# Patient Record
Sex: Female | Born: 1995
Health system: Southern US, Community
[De-identification: ages and names within clinical notes are randomized; demographics above are authoritative.]

## PROBLEM LIST (undated history)

## (undated) DIAGNOSIS — I10 Essential (primary) hypertension: Secondary | ICD-10-CM

## (undated) DIAGNOSIS — J45909 Unspecified asthma, uncomplicated: Secondary | ICD-10-CM

## (undated) HISTORY — DX: Unspecified asthma, uncomplicated: J45.909

## (undated) HISTORY — PX: BREAST REDUCTION SURGERY: SHX8

---
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
## 2015-08-09 ENCOUNTER — Encounter: Payer: Self-pay | Admitting: Obstetrics & Gynecology

## 2015-08-09 ENCOUNTER — Telehealth: Payer: Self-pay | Admitting: *Deleted

## 2015-08-09 ENCOUNTER — Ambulatory Visit (INDEPENDENT_AMBULATORY_CARE_PROVIDER_SITE_OTHER): Payer: BLUE CROSS/BLUE SHIELD | Admitting: Obstetrics & Gynecology

## 2015-08-09 VITALS — BP 143/82 | HR 89 | Resp 16 | Ht 63.0 in | Wt 180.0 lb

## 2015-08-09 DIAGNOSIS — B358 Other dermatophytoses: Secondary | ICD-10-CM | POA: Diagnosis not present

## 2015-08-09 DIAGNOSIS — N63 Unspecified lump in breast: Secondary | ICD-10-CM | POA: Diagnosis not present

## 2015-08-09 DIAGNOSIS — N6320 Unspecified lump in the left breast, unspecified quadrant: Secondary | ICD-10-CM

## 2015-08-09 MED ORDER — CLOTRIMAZOLE-BETAMETHASONE 1-0.05 % EX CREA: 1.0000 "application " | TOPICAL_CREAM | Freq: Two times a day (BID) | CUTANEOUS | Status: DC

## 2015-08-09 NOTE — Progress Notes (Signed)
   Subjective:    Patient ID: Emily Delgado, female    DOB: [birth year], 19 y.o.   MRN: [id number]  HPI  Pt c/o 2 rpoblems.  First the patient has a lump in left breast near sternum.  It beame red and swollen about 2 weeks ago.  She can still palpate the lump.  It is not painful any more.  There is no associated symptoms.  Nothing makes it better or worse.    Pt also c/o skin rash on legs that she thinks is ring worm.  It was been there for several weeks.  It started on right leg and is not on left hip and side of abdomen.  She has not used any OTC medications.  It is getting worse.  Nothing makes it better.  No pain.  Review of Systems  Constitutional: Negative.   HENT: Negative.   Respiratory: Negative.   Cardiovascular: Negative.   Gastrointestinal: Negative.   Endocrine: Negative.   Genitourinary:       Left breast mass  Musculoskeletal: Negative.   Skin: Positive for rash.  Psychiatric/Behavioral: Negative.    History reviewed. No pertinent past surgical history. Past Medical History  Diagnosis Date  . Asthma    Family history negative for breast, ovarian, cancers.    Objective:   Physical Exam  Constitutional: She is oriented to person, place, and time. She appears well-developed and well-nourished. No distress.  HENT:  Head: Normocephalic and atraumatic.  Eyes: Conjunctivae are normal.  Pulmonary/Chest: Effort normal.  Abdominal: Soft. She exhibits no distension. There is no tenderness.  Neurological: She is alert and oriented to person, place, and time.  Skin:  Skin with red annular lesions on left thigh and right hip.  Psychiatric: She has a normal mood and affect.  Breast:  ? Small lump in left inner quadrant of breast.  Deep.  No skin abnormalities.  Filed Vitals:   08/09/15 1301  BP: 143/82  Pulse: 89  Resp: 16  Height: 5\' 3"  (1.6 m)  Weight: 180 lb (81.647 kg)         Assessment & Plan:  19 yo female with 2 problems  1-Left breast mass  (self reported)--Get target breast US 2-Ring Worm--lotrisome 3-RTC at 20 for pap smear 4-PCP for BP check 4-F/U with PCP if Ring worm doesn't improve

## 2015-08-09 NOTE — Telephone Encounter (Signed)
Called pt to adv needs nurse visit for BP check - LMOM for pt to rtn call

## 2015-08-09 NOTE — Telephone Encounter (Signed)
-----   Message from Lesly DukesKelly H Leggett, MD sent at 08/09/2015  3:25 PM EST ----- Pt's BP was a little elevated.  Can you have her come back in for BP check at some point.

## 2015-08-17 ENCOUNTER — Ambulatory Visit: Payer: BLUE CROSS/BLUE SHIELD | Admitting: Obstetrics & Gynecology

## 2015-09-14 ENCOUNTER — Other Ambulatory Visit: Payer: Self-pay | Admitting: *Deleted

## 2015-09-14 DIAGNOSIS — B359 Dermatophytosis, unspecified: Secondary | ICD-10-CM

## 2015-09-14 MED ORDER — CLOTRIMAZOLE-BETAMETHASONE 1-0.05 % EX CREA: 1.0000 "application " | TOPICAL_CREAM | Freq: Two times a day (BID) | CUTANEOUS | Status: DC

## 2015-09-14 NOTE — Telephone Encounter (Signed)
RF request for Lotrisone cream OK'd @ [facility].  No further RF's will be authorized as patient will need to f/u with PCP.

## 2016-08-24 DIAGNOSIS — Z6831 Body mass index (BMI) 31.0-31.9, adult: Secondary | ICD-10-CM | POA: Diagnosis not present

## 2016-08-24 DIAGNOSIS — Z113 Encounter for screening for infections with a predominantly sexual mode of transmission: Secondary | ICD-10-CM | POA: Diagnosis not present

## 2016-08-24 DIAGNOSIS — Z3200 Encounter for pregnancy test, result unknown: Secondary | ICD-10-CM | POA: Diagnosis not present

## 2016-08-24 DIAGNOSIS — Z01419 Encounter for gynecological examination (general) (routine) without abnormal findings: Secondary | ICD-10-CM | POA: Diagnosis not present

## 2016-09-01 DIAGNOSIS — Z3200 Encounter for pregnancy test, result unknown: Secondary | ICD-10-CM | POA: Diagnosis not present

## 2016-09-25 DIAGNOSIS — R1084 Generalized abdominal pain: Secondary | ICD-10-CM | POA: Diagnosis not present

## 2016-09-25 DIAGNOSIS — Z5329 Procedure and treatment not carried out because of patient's decision for other reasons: Secondary | ICD-10-CM | POA: Diagnosis not present

## 2016-09-26 DIAGNOSIS — N390 Urinary tract infection, site not specified: Secondary | ICD-10-CM | POA: Diagnosis not present

## 2016-09-26 DIAGNOSIS — J45909 Unspecified asthma, uncomplicated: Secondary | ICD-10-CM | POA: Diagnosis not present

## 2016-09-26 DIAGNOSIS — Z7951 Long term (current) use of inhaled steroids: Secondary | ICD-10-CM | POA: Diagnosis not present

## 2016-09-26 DIAGNOSIS — R1031 Right lower quadrant pain: Secondary | ICD-10-CM | POA: Diagnosis not present

## 2016-10-11 DIAGNOSIS — B373 Candidiasis of vulva and vagina: Secondary | ICD-10-CM | POA: Diagnosis not present

## 2016-10-11 DIAGNOSIS — Z113 Encounter for screening for infections with a predominantly sexual mode of transmission: Secondary | ICD-10-CM | POA: Diagnosis not present

## 2016-10-11 DIAGNOSIS — R300 Dysuria: Secondary | ICD-10-CM | POA: Diagnosis not present

## 2016-11-29 DIAGNOSIS — Z3041 Encounter for surveillance of contraceptive pills: Secondary | ICD-10-CM | POA: Diagnosis not present

## 2016-11-29 DIAGNOSIS — R300 Dysuria: Secondary | ICD-10-CM | POA: Diagnosis not present

## 2016-11-29 DIAGNOSIS — N760 Acute vaginitis: Secondary | ICD-10-CM | POA: Diagnosis not present

## 2017-04-11 DIAGNOSIS — K2970 Gastritis, unspecified, without bleeding: Secondary | ICD-10-CM | POA: Diagnosis not present

## 2017-04-11 DIAGNOSIS — R1110 Vomiting, unspecified: Secondary | ICD-10-CM | POA: Diagnosis not present

## 2017-04-11 DIAGNOSIS — J45909 Unspecified asthma, uncomplicated: Secondary | ICD-10-CM | POA: Diagnosis not present

## 2017-04-11 DIAGNOSIS — R112 Nausea with vomiting, unspecified: Secondary | ICD-10-CM | POA: Diagnosis not present

## 2017-04-11 DIAGNOSIS — Z79899 Other long term (current) drug therapy: Secondary | ICD-10-CM | POA: Diagnosis not present

## 2017-05-13 ENCOUNTER — Encounter (HOSPITAL_COMMUNITY): Payer: Self-pay

## 2017-05-13 ENCOUNTER — Emergency Department (HOSPITAL_COMMUNITY): Payer: BLUE CROSS/BLUE SHIELD

## 2017-05-13 ENCOUNTER — Emergency Department (HOSPITAL_COMMUNITY)
Admission: EM | Admit: 2017-05-13 | Discharge: 2017-05-13 | Disposition: A | Discharge status: Home / Self Care | Payer: BLUE CROSS/BLUE SHIELD | Attending: Emergency Medicine | Admitting: Emergency Medicine

## 2017-05-13 DIAGNOSIS — R1030 Lower abdominal pain, unspecified: Secondary | ICD-10-CM | POA: Insufficient documentation

## 2017-05-13 DIAGNOSIS — R112 Nausea with vomiting, unspecified: Secondary | ICD-10-CM | POA: Diagnosis not present

## 2017-05-13 DIAGNOSIS — R109 Unspecified abdominal pain: Secondary | ICD-10-CM | POA: Diagnosis not present

## 2017-05-13 DIAGNOSIS — J45909 Unspecified asthma, uncomplicated: Secondary | ICD-10-CM | POA: Diagnosis not present

## 2017-05-13 LAB — URINALYSIS, ROUTINE W REFLEX MICROSCOPIC
BILIRUBIN URINE: NEGATIVE
GLUCOSE, UA: NEGATIVE mg/dL
Ketones, ur: NEGATIVE mg/dL
NITRITE: NEGATIVE
PH: 6 (ref 5.0–8.0)
Protein, ur: NEGATIVE mg/dL
SPECIFIC GRAVITY, URINE: 1.004 — AB (ref 1.005–1.030)

## 2017-05-13 LAB — CBC WITH DIFFERENTIAL/PLATELET
BASOS PCT: 0 %
Basophils Absolute: 0 10*3/uL (ref 0.0–0.1)
EOS ABS: 0.1 10*3/uL (ref 0.0–0.7)
Eosinophils Relative: 1 %
HCT: 42.9 % (ref 36.0–46.0)
HEMOGLOBIN: 14.9 g/dL (ref 12.0–15.0)
Lymphocytes Relative: 28 %
Lymphs Abs: 2.4 10*3/uL (ref 0.7–4.0)
MCH: 31.3 pg (ref 26.0–34.0)
MCHC: 34.7 g/dL (ref 30.0–36.0)
MCV: 90.1 fL (ref 78.0–100.0)
MONOS PCT: 5 %
Monocytes Absolute: 0.4 10*3/uL (ref 0.1–1.0)
NEUTROS PCT: 66 %
Neutro Abs: 5.7 10*3/uL (ref 1.7–7.7)
Platelets: 331 10*3/uL (ref 150–400)
RBC: 4.76 MIL/uL (ref 3.87–5.11)
RDW: 12 % (ref 11.5–15.5)
WBC: 8.7 10*3/uL (ref 4.0–10.5)

## 2017-05-13 LAB — PREGNANCY, URINE: Preg Test, Ur: NEGATIVE

## 2017-05-13 LAB — COMPREHENSIVE METABOLIC PANEL
ALBUMIN: 4.6 g/dL (ref 3.5–5.0)
ALK PHOS: 76 U/L (ref 38–126)
ALT: 17 U/L (ref 14–54)
ANION GAP: 9 (ref 5–15)
AST: 20 U/L (ref 15–41)
BUN: 10 mg/dL (ref 6–20)
CHLORIDE: 105 mmol/L (ref 101–111)
CO2: 22 mmol/L (ref 22–32)
Calcium: 9.1 mg/dL (ref 8.9–10.3)
Creatinine, Ser: 0.57 mg/dL (ref 0.44–1.00)
GFR calc Af Amer: >60 mL/min (ref >60)
GFR calc non Af Amer: >60 mL/min (ref >60)
GLUCOSE: 87 mg/dL (ref 65–99)
POTASSIUM: 3.6 mmol/L (ref 3.5–5.1)
SODIUM: 136 mmol/L (ref 135–145)
TOTAL PROTEIN: 8 g/dL (ref 6.5–8.1)
Total Bilirubin: 0.6 mg/dL (ref 0.3–1.2)

## 2017-05-13 MED ORDER — ONDANSETRON HCL 4 MG PO TABS: 4.0000 mg | ORAL_TABLET | Freq: Four times a day (QID) | ORAL | 0 refills | Status: DC

## 2017-05-13 MED ORDER — KETOROLAC TROMETHAMINE 60 MG/2ML IM SOLN
60.0000 mg | Freq: Once | INTRAMUSCULAR | Status: AC
  Administered 2017-05-13: 60 mg via INTRAMUSCULAR
  Filled 2017-05-13: qty 2

## 2017-05-13 MED ORDER — DICYCLOMINE HCL 10 MG PO CAPS
10.0000 mg | ORAL_CAPSULE | Freq: Once | ORAL | Status: AC
  Administered 2017-05-13: 10 mg via ORAL
  Filled 2017-05-13: qty 1

## 2017-05-13 MED ORDER — DICYCLOMINE HCL 20 MG PO TABS: 20.0000 mg | ORAL_TABLET | Freq: Two times a day (BID) | ORAL | 0 refills | Status: DC

## 2017-05-13 MED ORDER — PANTOPRAZOLE SODIUM 20 MG PO TBEC: 20.0000 mg | DELAYED_RELEASE_TABLET | Freq: Every day | ORAL | 0 refills | Status: DC

## 2017-05-13 NOTE — ED Provider Notes (Signed)
[facility] Provider Note   CSN: [id number] Arrival date & time: 05/13/17  1049     History   Chief Complaint Chief Complaint  Patient presents with  . Abdominal Pain    HPI Emily Delgado is a 21 y.o. female.  HPI  2 months of intermittent lower abdominal pain. States associated with some constipation decreased flatulence and nausea. Had been seen in the past for and had a negative evaluation to sound follow up with her doctor but she doesn't have a physician so she never did follow-up.she does not notice any other associated symptoms makes this better or makes it worse. She states that they sent her home on some type of strong Tylenol but this does not seem to help much. She recently started her period and does not think it associated with that. She has sexual active without protection but only with one of the person supposedly monogamous as well.  Past Medical History:  Diagnosis Date  . Asthma     There are no active problems to display for this patient.   History reviewed. No pertinent surgical history.  OB History    Gravida Para Term Preterm AB Living   0 0 0 0 0 0   SAB TAB Ectopic Multiple Live Births   0 0 0 0         Home Medications    Prior to Admission medications   Medication Sig Start Date End Date Taking? Authorizing Provider  albuterol (PROVENTIL HFA;VENTOLIN HFA) 108 (90 BASE) MCG/ACT inhaler Inhale 2 puffs into the lungs. 05/19/15 05/13/17 Yes [provider]  ibuprofen (ADVIL,MOTRIN) 200 MG tablet Take 400 mg by mouth every 6 (six) hours as needed for mild pain.   Yes [provider]  dicyclomine (BENTYL) 20 MG tablet Take 1 tablet (20 mg total) by mouth 2 (two) times daily. 05/13/17   Jeanne Terrance, Barbara Cower, MD  ondansetron (ZOFRAN) 4 MG tablet Take 1 tablet (4 mg total) by mouth every 6 (six) hours. 05/13/17   Keiley Levey, Barbara Cower, MD  pantoprazole (PROTONIX) 20 MG tablet Take 1 tablet (20 mg total) by mouth daily. 05/13/17    Lylianna Fraiser, Barbara Cower, MD    Family History No family history on file.  Social History Social History  Substance Use Topics  . Smoking status: Never Smoker  . Smokeless tobacco: Never Used  . Alcohol use 1.2 oz/week    2 Cans of beer per week     Allergies   Patient has no known allergies.   Review of Systems Review of Systems  All other systems reviewed and are negative.    Physical Exam Updated Vital Signs BP 134/72 (BP Location: Left Arm)   Pulse 81   Temp 98.5 F (36.9 C) (Oral)   Resp 16   Ht 5\' 4"  (1.626 m)   Wt 77.1 kg (170 lb)   LMP 05/12/2017 (Exact Date)   SpO2 96%   BMI 29.18 kg/m   Physical Exam  Constitutional: She appears well-developed and well-nourished.  HENT:  Head: Normocephalic and atraumatic.  Eyes: Conjunctivae and EOM are normal.  Neck: Normal range of motion.  Cardiovascular: Normal rate and regular rhythm.   Pulmonary/Chest: No stridor. No respiratory distress. She has no wheezes.  Abdominal: Soft. Bowel sounds are normal. She exhibits no distension. There is no tenderness. There is no guarding.  Neurological: She is alert.  Skin: Skin is warm and dry.  Nursing note and vitals reviewed.    ED Treatments / Results  Labs (all labs ordered are listed, but only abnormal results are displayed) Labs Reviewed  URINALYSIS, ROUTINE W REFLEX MICROSCOPIC - Abnormal; Notable for the following:       Result Value   Specific Gravity, Urine 1.004 (*)    Hgb urine dipstick SMALL (*)    Leukocytes, UA TRACE (*)    Bacteria, UA RARE (*)    Squamous Epithelial / LPF 0-5 (*)    All other components within normal limits  URINE CULTURE  CBC WITH DIFFERENTIAL/PLATELET  COMPREHENSIVE METABOLIC PANEL  PREGNANCY, URINE    EKG  EKG Interpretation None       Radiology Ct Renal Stone Study  Result Date: 05/13/2017 CLINICAL DATA:  Abdominal pain with nausea and bloating for 2 months. Flank pain and suspect stone disease. EXAM: CT ABDOMEN AND  PELVIS WITHOUT CONTRAST TECHNIQUE: Multidetector CT imaging of the abdomen and pelvis was performed following the standard protocol without IV contrast. COMPARISON:  None. FINDINGS: Lower chest: Partially calcified 5 mm nodule in the right lower lobe on sequence 3, image 2 is compatible with a calcified granuloma. No pleural effusions. 4 mm nodule in the left lower lobe on sequence 3, image 24. 5 mm nodule in left lower lobe on image 13. Hepatobiliary: Normal appearance the liver and gallbladder. Incidentally, the gallbladder fundus is folded on itself. Pancreas: Normal appearance of the pancreas without inflammation or duct dilatation. Spleen: Normal appearance of spleen without enlargement. Adrenals/Urinary Tract: Normal adrenal glands. Normal kidneys. No hydronephrosis and no stones. Urinary bladder is unremarkable. Stomach/Bowel: Stomach is within normal limits. No evidence of bowel wall thickening, distention, or inflammatory changes. Appendix is along the posterior cecum and near the right adnexa. No acute inflammatory changes in this area. Vascular/Lymphatic: No significant vascular findings are present. No enlarged abdominal or pelvic lymph nodes. Reproductive: Uterus and bilateral adnexa are unremarkable. Other: No significant free fluid.  No free air. Musculoskeletal: No acute or significant osseous findings. IMPRESSION: No acute abnormality in the abdomen or pelvis. Specifically, no evidence for kidney stones or hydronephrosis. Few small pulmonary nodules. These probably represent granulomas in a patient of this age. Electronically Signed   By: Richarda Overlie M.D.   On: 05/13/2017 14:10    Procedures Procedures (including critical care time)  Medications Ordered in ED Medications  ketorolac (TORADOL) injection 60 mg (60 mg Intramuscular Given 05/13/17 1240)  dicyclomine (BENTYL) capsule 10 mg (10 mg Oral Given 05/13/17 1239)     Initial Impression / Assessment and Plan / ED Course  I have reviewed  the triage vital signs and the nursing notes.  Pertinent labs & imaging results that were available during my care of the patient were reviewed by me and considered in my medical decision making (see chart for details).     Renal stone versus IBS versus colitis.  Workup unremarkable. pateint states pending pcp appointment, stable for dc and outpatient management at this time.   Final Clinical Impressions(s) / ED Diagnoses   Final diagnoses:  Abdominal pain, unspecified abdominal location    New Prescriptions Discharge Medication List as of 05/13/2017  2:38 PM    START taking these medications   Details  dicyclomine (BENTYL) 20 MG tablet Take 1 tablet (20 mg total) by mouth 2 (two) times daily., Starting Sun 05/13/2017, Print    ondansetron (ZOFRAN) 4 MG tablet Take 1 tablet (4 mg total) by mouth every 6 (six) hours., Starting Sun 05/13/2017, Print    pantoprazole (PROTONIX) 20 MG tablet Take 1  tablet (20 mg total) by mouth daily., Starting Sun 05/13/2017, Print         Nil Bolser, Barbara Cower, MD 05/14/17 1044

## 2017-05-13 NOTE — ED Triage Notes (Addendum)
Pt c/o abd pain, nausea, and bloating x2 months. Denies vomiting. Pt reports being seen at [facility] and having blood work taken but states "they didn't really do a lot other than give me a general medicine for stomach pain." Pt states she isn't sure what the medication is called.

## 2017-05-13 NOTE — ED Notes (Signed)
Patient transported to CT 

## 2017-05-13 NOTE — ED Notes (Signed)
ED Provider at bedside. 

## 2017-05-15 LAB — URINE CULTURE

## 2017-06-28 DIAGNOSIS — R10817 Generalized abdominal tenderness: Secondary | ICD-10-CM | POA: Diagnosis not present

## 2017-06-28 DIAGNOSIS — R109 Unspecified abdominal pain: Secondary | ICD-10-CM | POA: Diagnosis not present

## 2017-06-28 DIAGNOSIS — R140 Abdominal distension (gaseous): Secondary | ICD-10-CM | POA: Diagnosis not present

## 2017-06-28 DIAGNOSIS — R110 Nausea: Secondary | ICD-10-CM | POA: Diagnosis not present

## 2017-06-28 DIAGNOSIS — Z1389 Encounter for screening for other disorder: Secondary | ICD-10-CM | POA: Diagnosis not present

## 2017-07-20 DIAGNOSIS — N62 Hypertrophy of breast: Secondary | ICD-10-CM | POA: Diagnosis not present

## 2017-08-15 DIAGNOSIS — N62 Hypertrophy of breast: Secondary | ICD-10-CM | POA: Diagnosis not present

## 2017-09-04 DIAGNOSIS — M542 Cervicalgia: Secondary | ICD-10-CM | POA: Diagnosis not present

## 2017-09-04 DIAGNOSIS — M25511 Pain in right shoulder: Secondary | ICD-10-CM | POA: Diagnosis not present

## 2017-09-04 DIAGNOSIS — N62 Hypertrophy of breast: Secondary | ICD-10-CM | POA: Diagnosis not present

## 2017-09-04 DIAGNOSIS — M546 Pain in thoracic spine: Secondary | ICD-10-CM | POA: Diagnosis not present

## 2017-09-08 ENCOUNTER — Other Ambulatory Visit: Payer: Self-pay

## 2017-09-08 ENCOUNTER — Ambulatory Visit (HOSPITAL_COMMUNITY)
Admission: EM | Admit: 2017-09-08 | Discharge: 2017-09-08 | Disposition: A | Discharge status: Home / Self Care | Payer: BLUE CROSS/BLUE SHIELD | Attending: Family Medicine | Admitting: Family Medicine

## 2017-09-08 ENCOUNTER — Ambulatory Visit (INDEPENDENT_AMBULATORY_CARE_PROVIDER_SITE_OTHER): Payer: BLUE CROSS/BLUE SHIELD

## 2017-09-08 ENCOUNTER — Encounter (HOSPITAL_COMMUNITY): Payer: Self-pay | Admitting: *Deleted

## 2017-09-08 DIAGNOSIS — J4520 Mild intermittent asthma, uncomplicated: Secondary | ICD-10-CM | POA: Diagnosis not present

## 2017-09-08 DIAGNOSIS — J029 Acute pharyngitis, unspecified: Secondary | ICD-10-CM | POA: Diagnosis not present

## 2017-09-08 DIAGNOSIS — R0602 Shortness of breath: Secondary | ICD-10-CM | POA: Diagnosis not present

## 2017-09-08 DIAGNOSIS — R05 Cough: Secondary | ICD-10-CM

## 2017-09-08 DIAGNOSIS — J209 Acute bronchitis, unspecified: Secondary | ICD-10-CM | POA: Diagnosis not present

## 2017-09-08 MED ORDER — AZITHROMYCIN 250 MG PO TABS: 250.0000 mg | ORAL_TABLET | Freq: Every day | ORAL | 0 refills | Status: DC

## 2017-09-08 MED ORDER — IPRATROPIUM-ALBUTEROL 0.5-2.5 (3) MG/3ML IN SOLN
3.0000 mL | Freq: Once | RESPIRATORY_TRACT | Status: AC
  Administered 2017-09-08: 3 mL via RESPIRATORY_TRACT

## 2017-09-08 MED ORDER — IPRATROPIUM-ALBUTEROL 0.5-2.5 (3) MG/3ML IN SOLN
RESPIRATORY_TRACT | Status: AC
  Filled 2017-09-08: qty 3

## 2017-09-08 MED ORDER — PREDNISONE 20 MG PO TABS: ORAL_TABLET | ORAL | 0 refills | Status: DC

## 2017-09-08 NOTE — Discharge Instructions (Signed)
Please return if you are not improving in the next 1-2 days.

## 2017-09-08 NOTE — ED Notes (Signed)
Patient receiving breathing trx prior to CXR

## 2017-09-08 NOTE — ED Notes (Signed)
Discussed C/O's and VS with Dr. Milus GlazierLauenstein.

## 2017-09-08 NOTE — ED Provider Notes (Signed)
[facility]   [id number] 09/08/17 Arrival Time: 1217   SUBJECTIVE:  Korynne Docia FurlJane Mineo is a 21 y.o. female who presents to the urgent care with complaint of S/P breast reduction 3 days ago.  Started with sore throat following surgery - told likely from ET tube during surgery.  Yesterday started with feeling SOB with talking, cough, bilat "inner ear congestion".  Notified surgeon - was suggested she have breathing treatment and get checked.     Past Medical History:  Diagnosis Date  . Asthma    History reviewed. No pertinent family history. Social History   Socioeconomic History  . Marital status: Single    Spouse name: Not on file  . Number of children: Not on file  . Years of education: Not on file  . Highest education level: Not on file  Social Needs  . Financial resource strain: Not on file  . Food insecurity - worry: Not on file  . Food insecurity - inability: Not on file  . Transportation needs - medical: Not on file  . Transportation needs - non-medical: Not on file  Occupational History  . Occupation: Agricultural engineermakeup artist  Tobacco Use  . Smoking status: Never Smoker  . Smokeless tobacco: Never Used  Substance and Sexual Activity  . Alcohol use: No    Alcohol/week: 0.0 oz    Frequency: Never  . Drug use: No  . Sexual activity: Not on file  Other Topics Concern  . Not on file  Social History Narrative  . Not on file   Current Meds  Medication Sig  . albuterol (PROVENTIL HFA;VENTOLIN HFA) 108 (90 BASE) MCG/ACT inhaler Inhale 2 puffs into the lungs.  Marland Kitchen. HYDROmorphone (DILAUDID) 2 MG tablet Take by mouth every 4 (four) hours as needed for severe pain.  . methocarbamol (ROBAXIN) 500 MG tablet Take 500 mg by mouth 4 (four) times daily.  . [DISCONTINUED] GuaiFENesin (MUCINEX PO) Take by mouth.   No Known Allergies    ROS: As per HPI, remainder of ROS negative.   OBJECTIVE:   Vitals:   09/08/17 1302 09/08/17 1441  BP: 118/80   Pulse: (!) 117  (!) 111  Resp: (!) 24 20  Temp: 98 F (36.7 C)   TempSrc: Oral   SpO2: 93% 96%     General appearance: alert; no distress Eyes: PERRL; EOMI; conjunctiva normal HENT: normocephalic; atraumatic; TMs normal, canal normal, external ears normal without trauma; nasal mucosa normal; oral mucosa normal Neck: supple Lungs: rales and wheezes to auscultation left side; congested cough Heart: rapid rate and regular rhythm Back: no CVA tenderness Extremities: no cyanosis or edema; symmetrical with no gross deformities Skin: warm and dry Neurologic: normal gait; grossly normal Psychological: alert and cooperative; normal mood and affect   Labs:  Results for orders placed or performed during the hospital encounter of 05/13/17  Urine culture  Result Value Ref Range   Specimen Description URINE, RANDOM    Special Requests NONE    Culture MULTIPLE SPECIES PRESENT, SUGGEST RECOLLECTION (A)    Report Status 05/15/2017 FINAL   CBC with Differential  Result Value Ref Range   WBC 8.7 4.0 - 10.5 K/uL   RBC 4.76 3.87 - 5.11 MIL/uL   Hemoglobin 14.9 12.0 - 15.0 g/dL   HCT 95.642.9 21.336.0 - 08.646.0 %   MCV 90.1 78.0 - 100.0 fL   MCH 31.3 26.0 - 34.0 pg   MCHC 34.7 30.0 - 36.0 g/dL   RDW 57.812.0 46.911.5 - 62.915.5 %  Platelets 331 150 - 400 K/uL   Neutrophils Relative % 66 %   Neutro Abs 5.7 1.7 - 7.7 K/uL   Lymphocytes Relative 28 %   Lymphs Abs 2.4 0.7 - 4.0 K/uL   Monocytes Relative 5 %   Monocytes Absolute 0.4 0.1 - 1.0 K/uL   Eosinophils Relative 1 %   Eosinophils Absolute 0.1 0.0 - 0.7 K/uL   Basophils Relative 0 %   Basophils Absolute 0.0 0.0 - 0.1 K/uL  Comprehensive metabolic panel  Result Value Ref Range   Sodium 136 135 - 145 mmol/L   Potassium 3.6 3.5 - 5.1 mmol/L   Chloride 105 101 - 111 mmol/L   CO2 22 22 - 32 mmol/L   Glucose, Bld 87 65 - 99 mg/dL   BUN 10 6 - 20 mg/dL   Creatinine, Ser 9.60 0.44 - 1.00 mg/dL   Calcium 9.1 8.9 - 45.4 mg/dL   Total Protein 8.0 6.5 - 8.1 g/dL   Albumin  4.6 3.5 - 5.0 g/dL   AST 20 15 - 41 U/L   ALT 17 14 - 54 U/L   Alkaline Phosphatase 76 38 - 126 U/L   Total Bilirubin 0.6 0.3 - 1.2 mg/dL   GFR calc non Af Amer >60 >60 mL/min   GFR calc Af Amer >60 >60 mL/min   Anion gap 9 5 - 15  Urinalysis, Routine w reflex microscopic  Result Value Ref Range   Color, Urine YELLOW YELLOW   APPearance CLEAR CLEAR   Specific Gravity, Urine 1.004 (L) 1.005 - 1.030   pH 6.0 5.0 - 8.0   Glucose, UA NEGATIVE NEGATIVE mg/dL   Hgb urine dipstick SMALL (A) NEGATIVE   Bilirubin Urine NEGATIVE NEGATIVE   Ketones, ur NEGATIVE NEGATIVE mg/dL   Protein, ur NEGATIVE NEGATIVE mg/dL   Nitrite NEGATIVE NEGATIVE   Leukocytes, UA TRACE (A) NEGATIVE   RBC / HPF 0-5 0 - 5 RBC/hpf   WBC, UA 0-5 0 - 5 WBC/hpf   Bacteria, UA RARE (A) NONE SEEN   Squamous Epithelial / LPF 0-5 (A) NONE SEEN  Pregnancy, urine  Result Value Ref Range   Preg Test, Ur NEGATIVE NEGATIVE    Labs Reviewed - No data to display  Dg Chest 2 View  Result Date: 09/08/2017 CLINICAL DATA:  Cough, shortness of breath and history of asthma. EXAM: CHEST  2 VIEW COMPARISON:  None. FINDINGS: The heart size and mediastinal contours are within normal limits. Lung volumes are normal. There is some mild bronchial thickening bilaterally and potentially mild mucous plugging versus atelectasis in focal areas of the left upper lobe and left lower lobe. No edema, pneumothorax or pleural fluid. The visualized skeletal structures are unremarkable. IMPRESSION: Mild bronchial thickening bilaterally. Potential focal areas of mucous plugging versus atelectasis in the left upper and lower lobes. Electronically Signed   By: Irish Lack M.D.   On: 09/08/2017 14:30     Oxygen sat 96-97 after breathing treatment  ASSESSMENT & PLAN:  1. Acute bronchitis, unspecified organism   2. Mild intermittent asthma without complication     Meds ordered this encounter  Medications  . ipratropium-albuterol (DUONEB) 0.5-2.5  (3) MG/3ML nebulizer solution 3 mL  . azithromycin (ZITHROMAX) 250 MG tablet    Sig: Take 1 tablet (250 mg total) by mouth daily. Take first 2 tablets together, then 1 every day until finished.    Dispense:  6 tablet    Refill:  0  . predniSONE (DELTASONE) 20 MG tablet  Sig: Two daily with food    Dispense:  10 tablet    Refill:  0    Reviewed expectations re: course of current medical issues. Questions answered. Outlined signs and symptoms indicating need for more acute intervention. Patient verbalized understanding. After Visit Summary given.    Procedures:      Elvina SidleLauenstein, Adelfa Lozito, MD 09/08/17 1443

## 2017-09-08 NOTE — ED Triage Notes (Addendum)
Pt 4 days S/P breast reduction.  Started with sore throat following surgery - told likely from ET tube during surgery.  Yesterday started with feeling SOB with talking, cough, bilat "inner ear congestion".  Notified surgeon - was suggested she have breathing treatment and get checked.  Has been taking Mucinex and albuterol without relief.

## 2018-02-09 IMAGING — CT CT RENAL STONE PROTOCOL
2 of 3 series · 16 of 46 positions shown, 18 images · non-contrast
Comparison: None.

CLINICAL DATA: Abdominal pain with nausea and bloating for 2
months. Flank pain and suspect stone disease.

EXAM:
CT ABDOMEN AND PELVIS WITHOUT CONTRAST
TECHNIQUE: Multidetector CT imaging of the abdomen and pelvis was performed
following the standard protocol without IV contrast.

[Series 3: lung · axial · 0.74mm/px · z∈[+1166,+1254]mm · 13 of 52 slices shown, 15 images]
[im 4/52  soft-tissue]
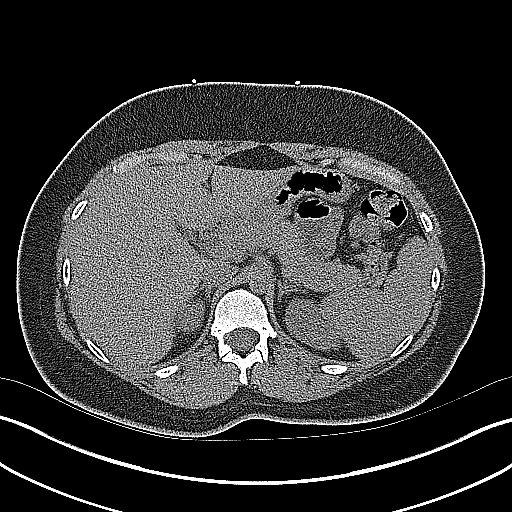
[im 4/52  bone]
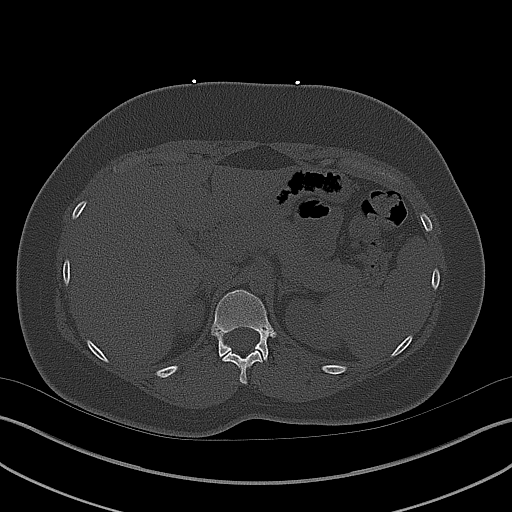
[im 7/52  soft-tissue]
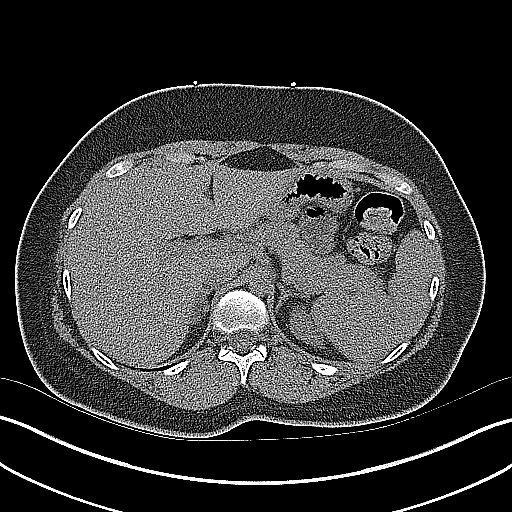
[im 10/52  soft-tissue]
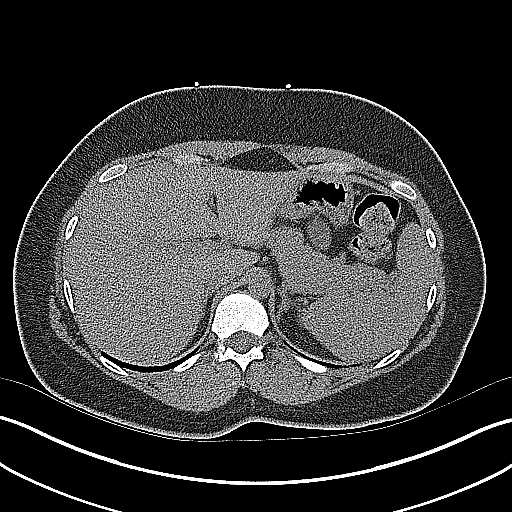
[im 15/52  soft-tissue]
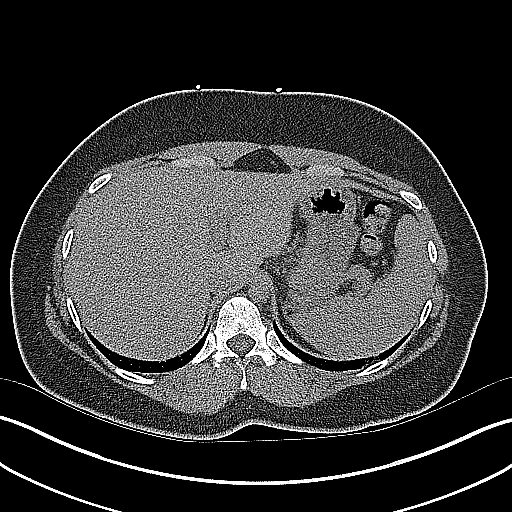
[im 19/52  soft-tissue]
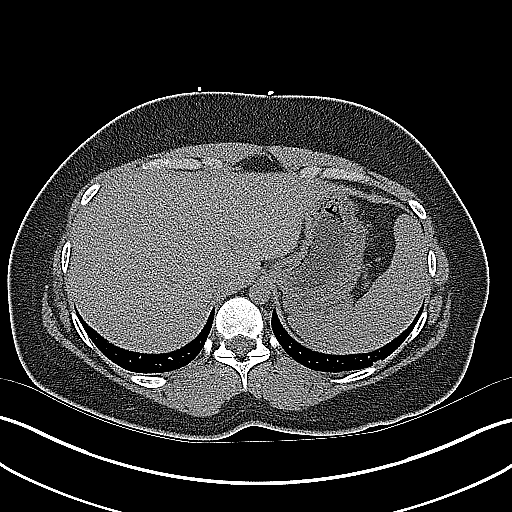
[im 22/52  soft-tissue]
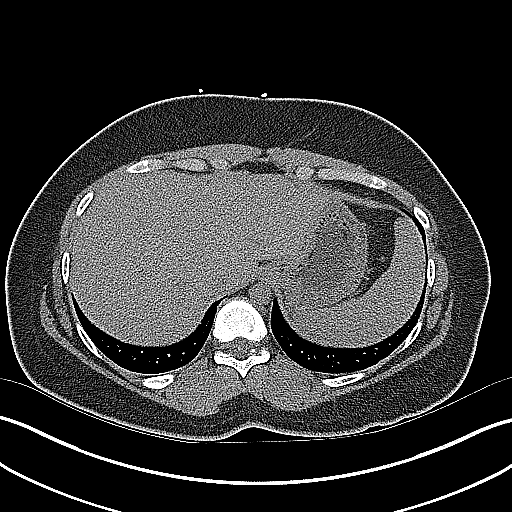
[im 27/52  soft-tissue]
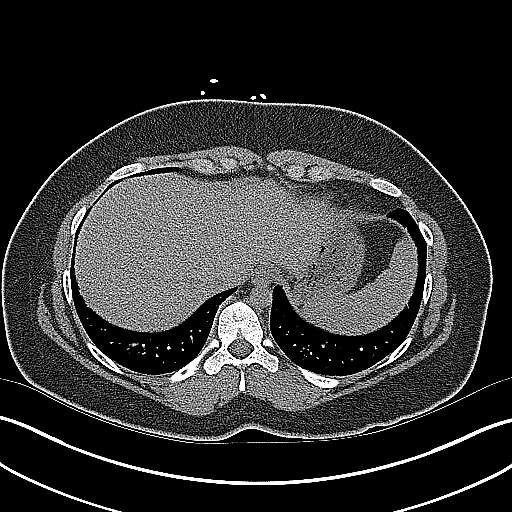
[im 30/52  soft-tissue]
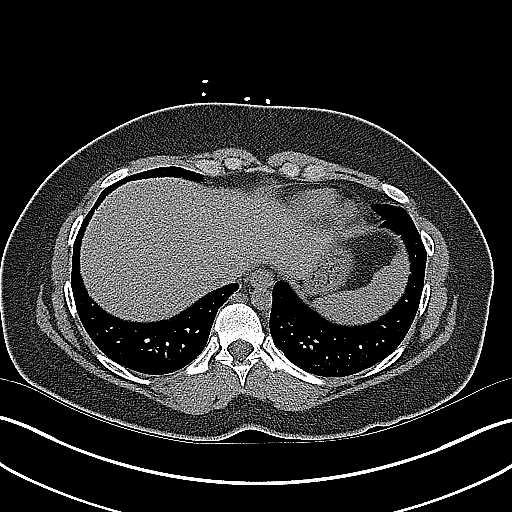
[im 33/52  soft-tissue]
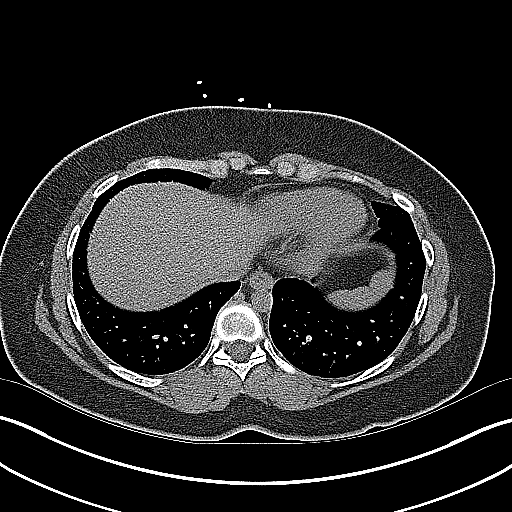
[im 33/52  bone]
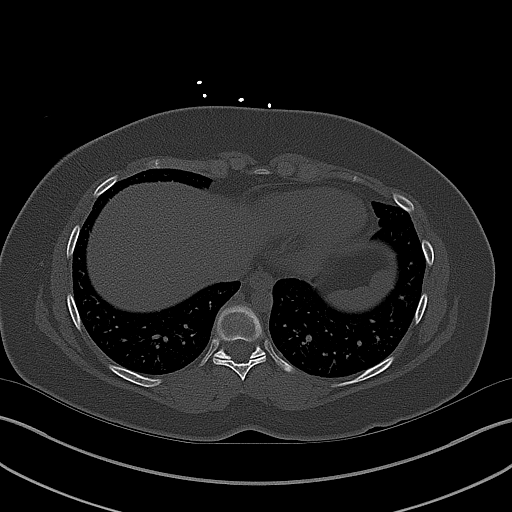
[im 37/52  soft-tissue]
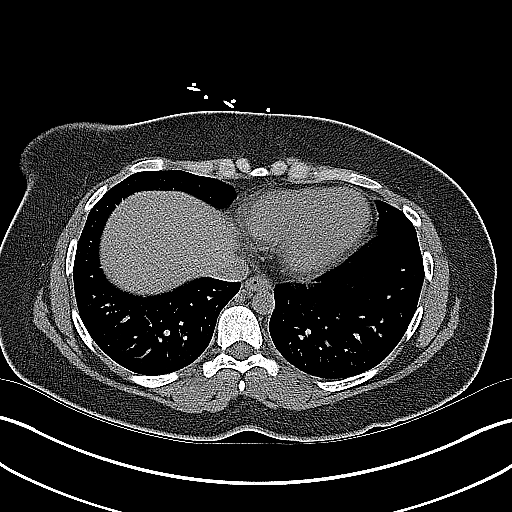
[im 42/52  soft-tissue]
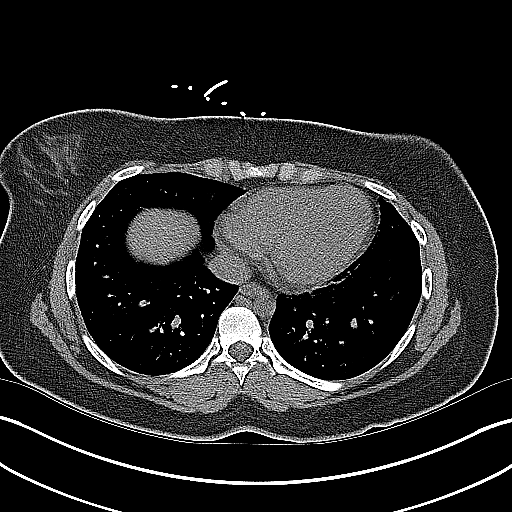
[im 45/52  soft-tissue]
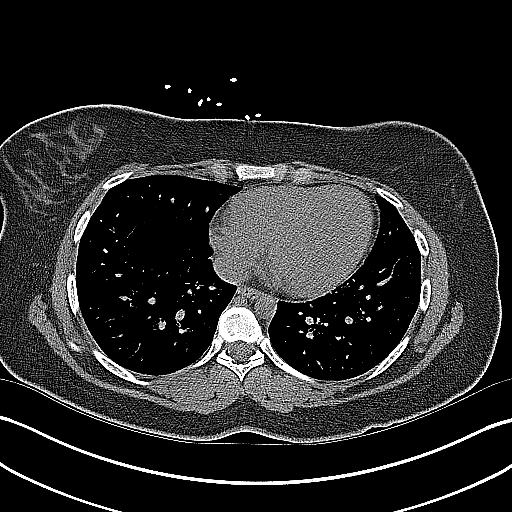
[im 48/52  soft-tissue]
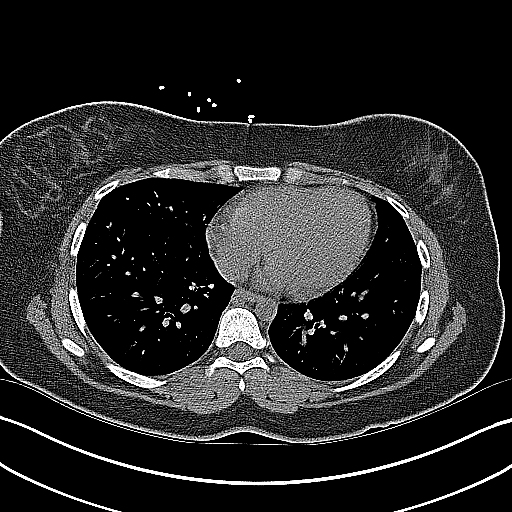

[Series 4: coronal · coronal · 0.74mm/px · 3 of 119 slices shown]
[im 40/119  soft-tissue]
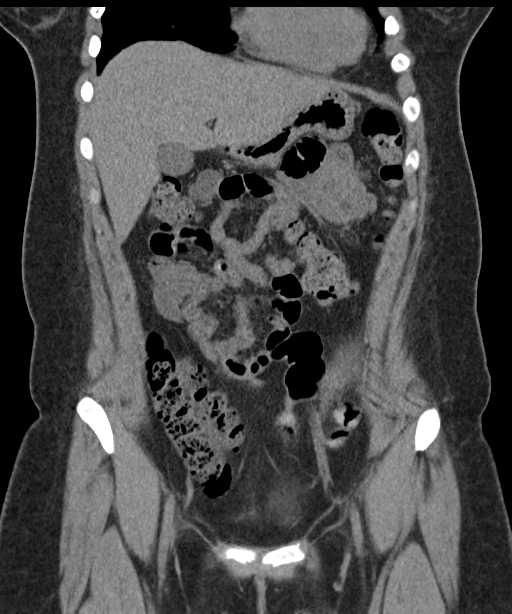
[im 53/119  soft-tissue]
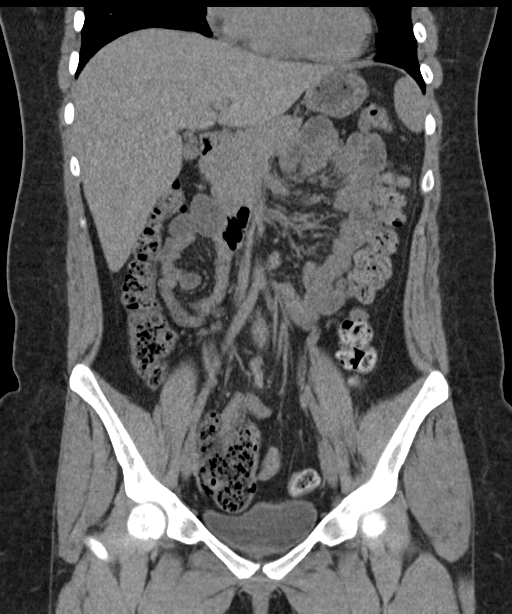
[im 66/119  soft-tissue]
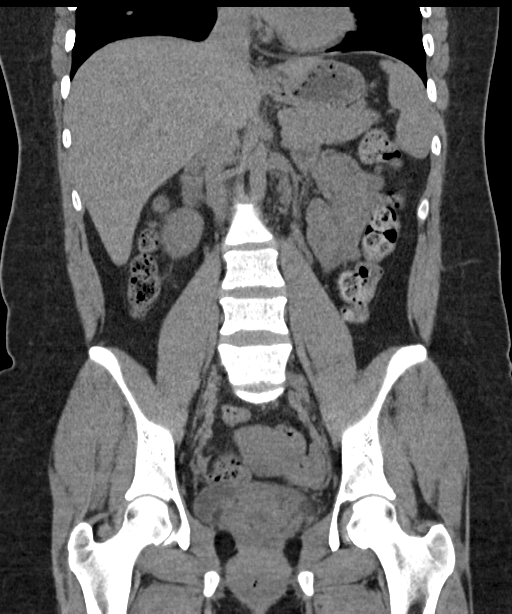

[16 of 46 positions shown; findings below may reference images not displayed]

FINDINGS: Lower chest: Partially calcified 5 mm nodule in the right lower lobe
on sequence 3, image 2 is compatible with a calcified granuloma. No
pleural effusions. 4 mm nodule in the left lower lobe on sequence 3,
image 24. 5 mm nodule in left lower lobe on image 13.

Hepatobiliary: Normal appearance the liver and gallbladder.
Incidentally, the gallbladder fundus is folded on itself.

Pancreas: Normal appearance of the pancreas without inflammation or
duct dilatation.

Spleen: Normal appearance of spleen without enlargement.

Adrenals/Urinary Tract: Normal adrenal glands. Normal kidneys. No
hydronephrosis and no stones. Urinary bladder is unremarkable.

Stomach/Bowel: Stomach is within normal limits. No evidence of bowel
wall thickening, distention, or inflammatory changes. Appendix is
along the posterior cecum and near the right adnexa. No acute
inflammatory changes in this area.

Vascular/Lymphatic: No significant vascular findings are present. No
enlarged abdominal or pelvic lymph nodes.

Reproductive: Uterus and bilateral adnexa are unremarkable.

Other: No significant free fluid.  No free air.

Musculoskeletal: No acute or significant osseous findings.
IMPRESSION: No acute abnormality in the abdomen or pelvis. Specifically, no
evidence for kidney stones or hydronephrosis.

Few small pulmonary nodules. These probably represent granulomas in
a patient of this age.

## 2018-04-17 DIAGNOSIS — Z6835 Body mass index (BMI) 35.0-35.9, adult: Secondary | ICD-10-CM | POA: Diagnosis not present

## 2018-04-17 DIAGNOSIS — N915 Oligomenorrhea, unspecified: Secondary | ICD-10-CM | POA: Diagnosis not present

## 2018-04-17 DIAGNOSIS — Z01419 Encounter for gynecological examination (general) (routine) without abnormal findings: Secondary | ICD-10-CM | POA: Diagnosis not present

## 2018-04-17 DIAGNOSIS — E229 Hyperfunction of pituitary gland, unspecified: Secondary | ICD-10-CM | POA: Diagnosis not present

## 2018-04-24 DIAGNOSIS — N926 Irregular menstruation, unspecified: Secondary | ICD-10-CM | POA: Diagnosis not present

## 2018-04-24 DIAGNOSIS — E229 Hyperfunction of pituitary gland, unspecified: Secondary | ICD-10-CM | POA: Diagnosis not present

## 2018-06-07 IMAGING — DX DG CHEST 2V
2 series · 2 of 2 positions shown · non-contrast
Comparison: None.

CLINICAL DATA: Cough, shortness of breath and history of asthma.

EXAM:
CHEST  2 VIEW

[chest pa]
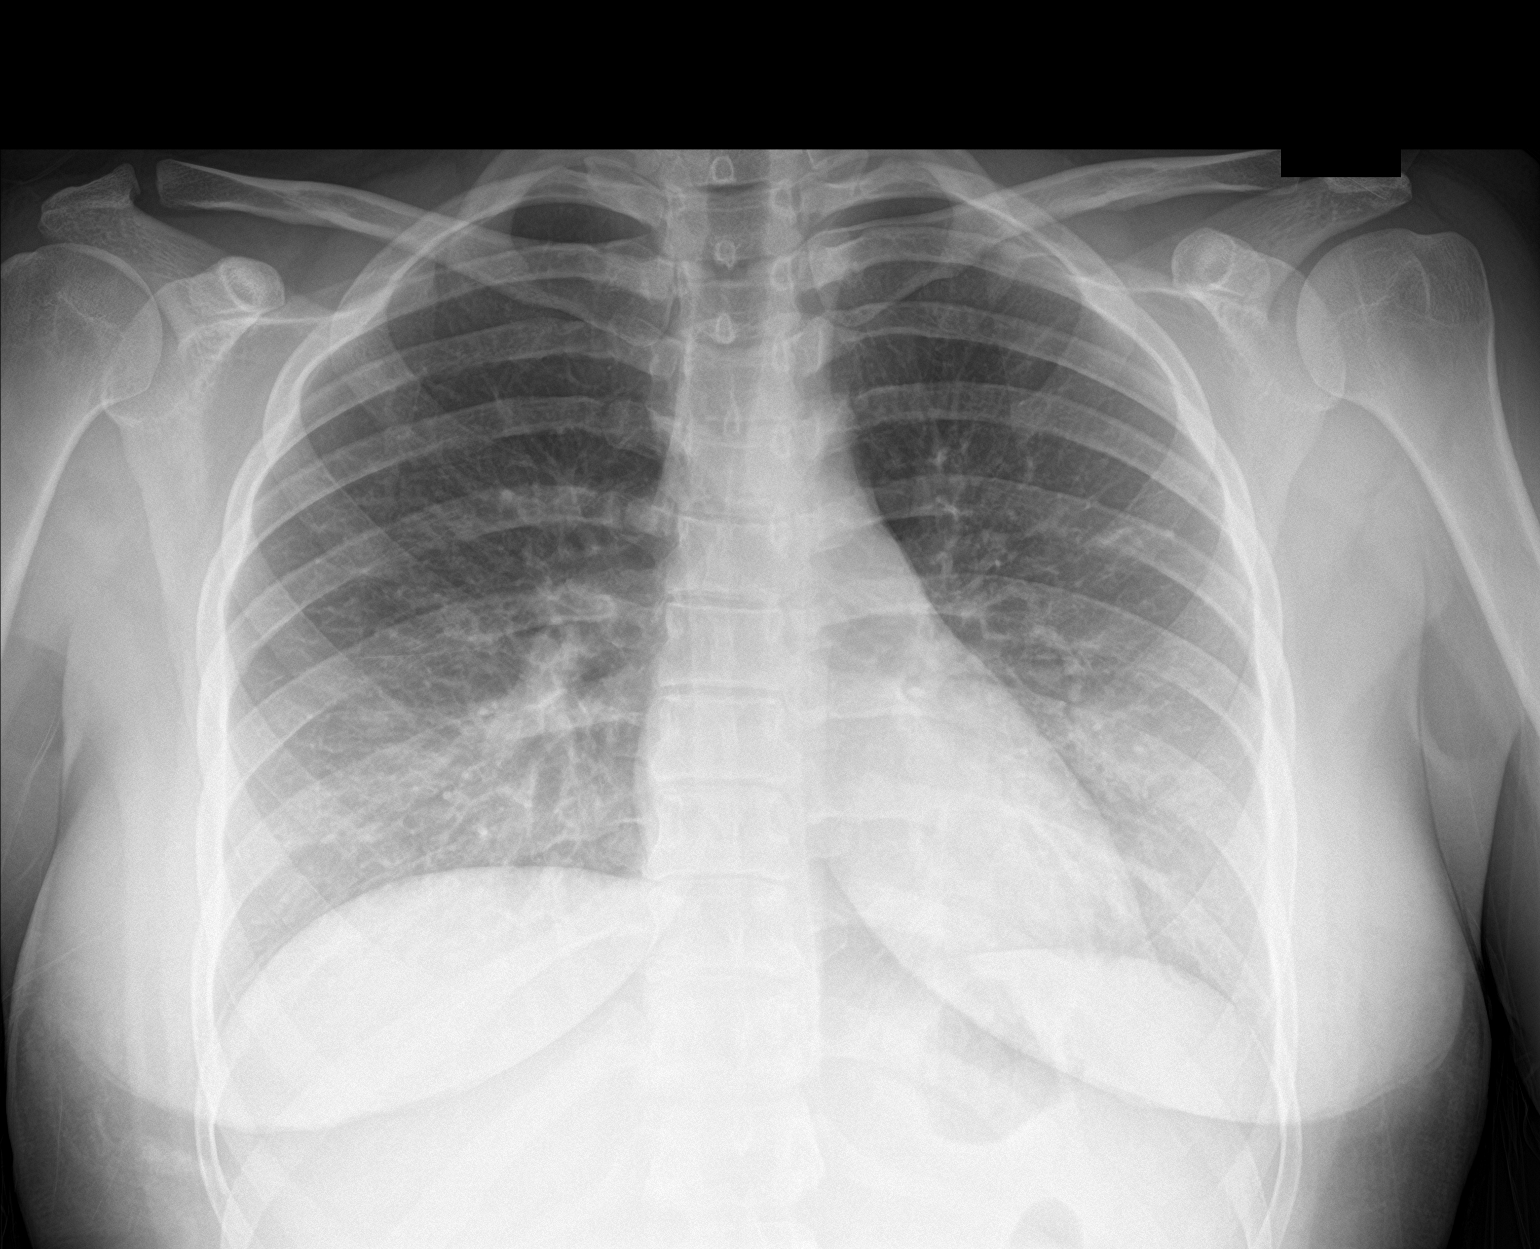

[chest lat]
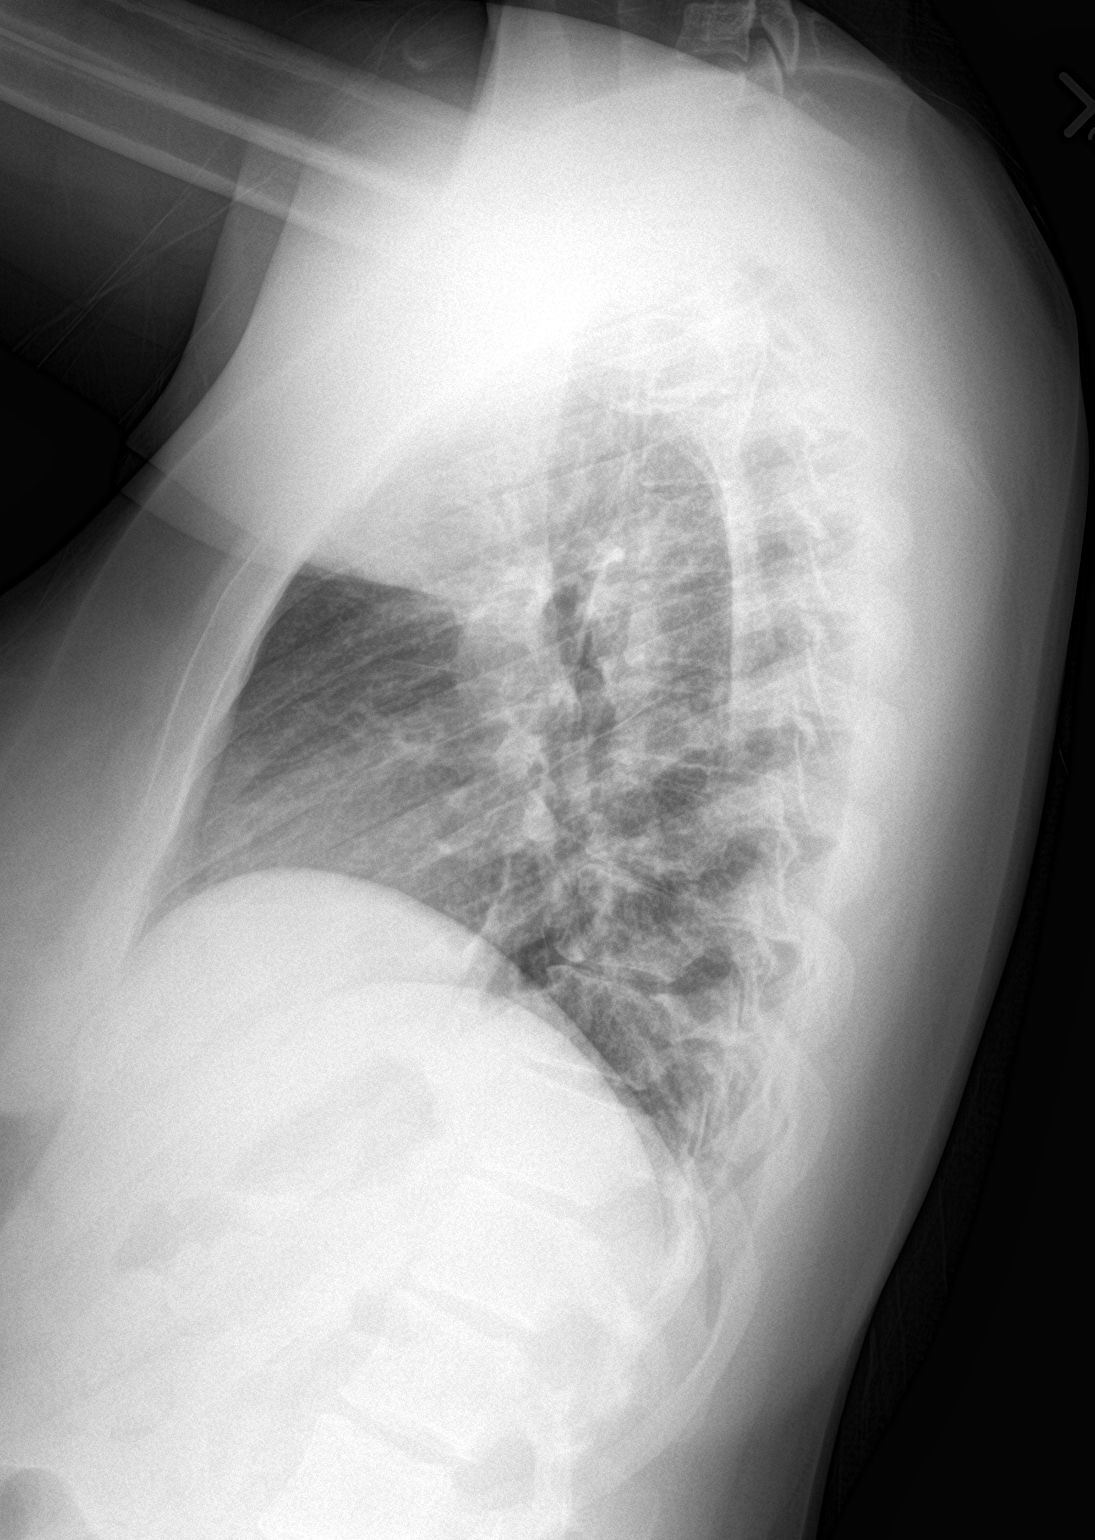

[2 of 2 positions shown; findings below may reference images not displayed]

FINDINGS: The heart size and mediastinal contours are within normal limits.
Lung volumes are normal. There is some mild bronchial thickening
bilaterally and potentially mild mucous plugging versus atelectasis
in focal areas of the left upper lobe and left lower lobe. No edema,
pneumothorax or pleural fluid. The visualized skeletal structures
are unremarkable.
IMPRESSION: Mild bronchial thickening bilaterally. Potential focal areas of
mucous plugging versus atelectasis in the left upper and lower
lobes.

## 2018-07-25 DIAGNOSIS — E229 Hyperfunction of pituitary gland, unspecified: Secondary | ICD-10-CM | POA: Diagnosis not present

## 2018-07-25 DIAGNOSIS — N915 Oligomenorrhea, unspecified: Secondary | ICD-10-CM | POA: Diagnosis not present

## 2018-11-25 DIAGNOSIS — Z3201 Encounter for pregnancy test, result positive: Secondary | ICD-10-CM | POA: Diagnosis not present

## 2018-12-13 DIAGNOSIS — Z3201 Encounter for pregnancy test, result positive: Secondary | ICD-10-CM | POA: Diagnosis not present

## 2018-12-13 DIAGNOSIS — J45909 Unspecified asthma, uncomplicated: Secondary | ICD-10-CM | POA: Diagnosis not present

## 2018-12-25 DIAGNOSIS — Z3401 Encounter for supervision of normal first pregnancy, first trimester: Secondary | ICD-10-CM | POA: Diagnosis not present

## 2018-12-25 LAB — OB RESULTS CONSOLE GC/CHLAMYDIA
Chlamydia: NEGATIVE
Gonorrhea: NEGATIVE

## 2018-12-25 LAB — OB RESULTS CONSOLE RUBELLA ANTIBODY, IGM: Rubella: IMMUNE

## 2018-12-25 LAB — OB RESULTS CONSOLE ANTIBODY SCREEN: Antibody Screen: NEGATIVE

## 2018-12-25 LAB — OB RESULTS CONSOLE ABO/RH: RH Type: NEGATIVE

## 2018-12-25 LAB — OB RESULTS CONSOLE HEPATITIS B SURFACE ANTIGEN: Hepatitis B Surface Ag: NEGATIVE

## 2018-12-25 LAB — OB RESULTS CONSOLE RPR: RPR: NONREACTIVE

## 2018-12-25 LAB — OB RESULTS CONSOLE HIV ANTIBODY (ROUTINE TESTING): HIV: NONREACTIVE

## 2019-01-08 DIAGNOSIS — Z3689 Encounter for other specified antenatal screening: Secondary | ICD-10-CM | POA: Diagnosis not present

## 2019-01-08 DIAGNOSIS — Z118 Encounter for screening for other infectious and parasitic diseases: Secondary | ICD-10-CM | POA: Diagnosis not present

## 2019-01-08 DIAGNOSIS — Z3401 Encounter for supervision of normal first pregnancy, first trimester: Secondary | ICD-10-CM | POA: Diagnosis not present

## 2019-01-08 DIAGNOSIS — Z113 Encounter for screening for infections with a predominantly sexual mode of transmission: Secondary | ICD-10-CM | POA: Diagnosis not present

## 2019-01-08 DIAGNOSIS — O161 Unspecified maternal hypertension, first trimester: Secondary | ICD-10-CM | POA: Diagnosis not present

## 2019-01-08 DIAGNOSIS — Z124 Encounter for screening for malignant neoplasm of cervix: Secondary | ICD-10-CM | POA: Diagnosis not present

## 2019-01-13 DIAGNOSIS — Z3401 Encounter for supervision of normal first pregnancy, first trimester: Secondary | ICD-10-CM | POA: Diagnosis not present

## 2019-01-14 DIAGNOSIS — R062 Wheezing: Secondary | ICD-10-CM | POA: Diagnosis not present

## 2019-01-14 DIAGNOSIS — J302 Other seasonal allergic rhinitis: Secondary | ICD-10-CM | POA: Diagnosis not present

## 2019-01-14 DIAGNOSIS — R05 Cough: Secondary | ICD-10-CM | POA: Diagnosis not present

## 2019-01-14 DIAGNOSIS — R0602 Shortness of breath: Secondary | ICD-10-CM | POA: Diagnosis not present

## 2019-01-14 DIAGNOSIS — B349 Viral infection, unspecified: Secondary | ICD-10-CM | POA: Diagnosis not present

## 2019-01-21 ENCOUNTER — Ambulatory Visit: Payer: BLUE CROSS/BLUE SHIELD | Admitting: Allergy & Immunology

## 2019-01-23 ENCOUNTER — Ambulatory Visit: Payer: Self-pay | Admitting: Allergy

## 2019-02-06 DIAGNOSIS — O133 Gestational [pregnancy-induced] hypertension without significant proteinuria, third trimester: Secondary | ICD-10-CM | POA: Diagnosis not present

## 2019-02-06 DIAGNOSIS — Z3A14 14 weeks gestation of pregnancy: Secondary | ICD-10-CM | POA: Diagnosis not present

## 2019-02-11 DIAGNOSIS — Z361 Encounter for antenatal screening for raised alphafetoprotein level: Secondary | ICD-10-CM | POA: Diagnosis not present

## 2019-02-11 DIAGNOSIS — O10912 Unspecified pre-existing hypertension complicating pregnancy, second trimester: Secondary | ICD-10-CM | POA: Diagnosis not present

## 2019-02-20 DIAGNOSIS — O10912 Unspecified pre-existing hypertension complicating pregnancy, second trimester: Secondary | ICD-10-CM | POA: Diagnosis not present

## 2019-03-11 DIAGNOSIS — Z3A19 19 weeks gestation of pregnancy: Secondary | ICD-10-CM | POA: Diagnosis not present

## 2019-03-11 DIAGNOSIS — Z363 Encounter for antenatal screening for malformations: Secondary | ICD-10-CM | POA: Diagnosis not present

## 2019-03-11 DIAGNOSIS — O10912 Unspecified pre-existing hypertension complicating pregnancy, second trimester: Secondary | ICD-10-CM | POA: Diagnosis not present

## 2019-04-10 DIAGNOSIS — Z3A23 23 weeks gestation of pregnancy: Secondary | ICD-10-CM | POA: Diagnosis not present

## 2019-04-10 DIAGNOSIS — O10912 Unspecified pre-existing hypertension complicating pregnancy, second trimester: Secondary | ICD-10-CM | POA: Diagnosis not present

## 2019-05-05 DIAGNOSIS — Z3A27 27 weeks gestation of pregnancy: Secondary | ICD-10-CM | POA: Diagnosis not present

## 2019-05-05 DIAGNOSIS — Z3689 Encounter for other specified antenatal screening: Secondary | ICD-10-CM | POA: Diagnosis not present

## 2019-05-05 DIAGNOSIS — O10912 Unspecified pre-existing hypertension complicating pregnancy, second trimester: Secondary | ICD-10-CM | POA: Diagnosis not present

## 2019-05-13 DIAGNOSIS — O10913 Unspecified pre-existing hypertension complicating pregnancy, third trimester: Secondary | ICD-10-CM | POA: Diagnosis not present

## 2019-05-13 DIAGNOSIS — O26893 Other specified pregnancy related conditions, third trimester: Secondary | ICD-10-CM | POA: Diagnosis not present

## 2019-05-13 DIAGNOSIS — R109 Unspecified abdominal pain: Secondary | ICD-10-CM | POA: Diagnosis not present

## 2019-05-13 DIAGNOSIS — Z23 Encounter for immunization: Secondary | ICD-10-CM | POA: Diagnosis not present

## 2019-05-13 DIAGNOSIS — O10919 Unspecified pre-existing hypertension complicating pregnancy, unspecified trimester: Secondary | ICD-10-CM | POA: Diagnosis not present

## 2019-05-13 DIAGNOSIS — Z3A28 28 weeks gestation of pregnancy: Secondary | ICD-10-CM | POA: Diagnosis not present

## 2019-05-13 DIAGNOSIS — B373 Candidiasis of vulva and vagina: Secondary | ICD-10-CM | POA: Diagnosis not present

## 2019-05-13 DIAGNOSIS — Z3689 Encounter for other specified antenatal screening: Secondary | ICD-10-CM | POA: Diagnosis not present

## 2019-05-13 DIAGNOSIS — O360130 Maternal care for anti-D [Rh] antibodies, third trimester, not applicable or unspecified: Secondary | ICD-10-CM | POA: Diagnosis not present

## 2019-05-23 DIAGNOSIS — Z3A30 30 weeks gestation of pregnancy: Secondary | ICD-10-CM | POA: Diagnosis not present

## 2019-05-23 DIAGNOSIS — O368130 Decreased fetal movements, third trimester, not applicable or unspecified: Secondary | ICD-10-CM | POA: Diagnosis not present

## 2019-05-23 DIAGNOSIS — O133 Gestational [pregnancy-induced] hypertension without significant proteinuria, third trimester: Secondary | ICD-10-CM | POA: Diagnosis not present

## 2019-05-23 DIAGNOSIS — O10919 Unspecified pre-existing hypertension complicating pregnancy, unspecified trimester: Secondary | ICD-10-CM | POA: Diagnosis not present

## 2019-05-27 DIAGNOSIS — O10913 Unspecified pre-existing hypertension complicating pregnancy, third trimester: Secondary | ICD-10-CM | POA: Diagnosis not present

## 2019-05-27 DIAGNOSIS — O10919 Unspecified pre-existing hypertension complicating pregnancy, unspecified trimester: Secondary | ICD-10-CM | POA: Diagnosis not present

## 2019-05-27 DIAGNOSIS — Z3A30 30 weeks gestation of pregnancy: Secondary | ICD-10-CM | POA: Diagnosis not present

## 2019-06-05 DIAGNOSIS — O163 Unspecified maternal hypertension, third trimester: Secondary | ICD-10-CM | POA: Diagnosis not present

## 2019-06-05 DIAGNOSIS — Z3A31 31 weeks gestation of pregnancy: Secondary | ICD-10-CM | POA: Diagnosis not present

## 2019-06-11 DIAGNOSIS — O10919 Unspecified pre-existing hypertension complicating pregnancy, unspecified trimester: Secondary | ICD-10-CM | POA: Diagnosis not present

## 2019-06-11 DIAGNOSIS — Z3A32 32 weeks gestation of pregnancy: Secondary | ICD-10-CM | POA: Diagnosis not present

## 2019-06-11 DIAGNOSIS — O10013 Pre-existing essential hypertension complicating pregnancy, third trimester: Secondary | ICD-10-CM | POA: Diagnosis not present

## 2019-06-17 ENCOUNTER — Inpatient Hospital Stay (HOSPITAL_COMMUNITY)
Admission: AD | Admit: 2019-06-17 | Discharge: 2019-06-17 | Disposition: A | Discharge status: Home / Self Care | Payer: BC Managed Care – PPO | Attending: Obstetrics and Gynecology | Admitting: Obstetrics and Gynecology

## 2019-06-17 ENCOUNTER — Other Ambulatory Visit: Payer: Self-pay

## 2019-06-17 ENCOUNTER — Encounter (HOSPITAL_COMMUNITY): Payer: Self-pay | Admitting: *Deleted

## 2019-06-17 DIAGNOSIS — O113 Pre-existing hypertension with pre-eclampsia, third trimester: Secondary | ICD-10-CM | POA: Diagnosis not present

## 2019-06-17 DIAGNOSIS — O119 Pre-existing hypertension with pre-eclampsia, unspecified trimester: Secondary | ICD-10-CM

## 2019-06-17 DIAGNOSIS — Z3A33 33 weeks gestation of pregnancy: Secondary | ICD-10-CM | POA: Insufficient documentation

## 2019-06-17 DIAGNOSIS — O26893 Other specified pregnancy related conditions, third trimester: Secondary | ICD-10-CM | POA: Diagnosis not present

## 2019-06-17 DIAGNOSIS — R109 Unspecified abdominal pain: Secondary | ICD-10-CM | POA: Diagnosis not present

## 2019-06-17 DIAGNOSIS — O368130 Decreased fetal movements, third trimester, not applicable or unspecified: Secondary | ICD-10-CM | POA: Diagnosis not present

## 2019-06-17 HISTORY — DX: Essential (primary) hypertension: I10

## 2019-06-17 LAB — URINALYSIS, ROUTINE W REFLEX MICROSCOPIC
Bilirubin Urine: NEGATIVE
Glucose, UA: NEGATIVE mg/dL
Hgb urine dipstick: NEGATIVE
Ketones, ur: NEGATIVE mg/dL
Nitrite: NEGATIVE
Protein, ur: NEGATIVE mg/dL
Specific Gravity, Urine: 1.02 (ref 1.005–1.030)
pH: 6 (ref 5.0–8.0)

## 2019-06-17 LAB — COMPREHENSIVE METABOLIC PANEL
ALT: 14 U/L (ref 0–44)
AST: 16 U/L (ref 15–41)
Albumin: 2.7 g/dL — ABNORMAL LOW (ref 3.5–5.0)
Alkaline Phosphatase: 117 U/L (ref 38–126)
Anion gap: 11 (ref 5–15)
BUN: 11 mg/dL (ref 6–20)
CO2: 21 mmol/L — ABNORMAL LOW (ref 22–32)
Calcium: 9 mg/dL (ref 8.9–10.3)
Chloride: 103 mmol/L (ref 98–111)
Creatinine, Ser: 0.55 mg/dL (ref 0.44–1.00)
GFR calc Af Amer: >60 mL/min (ref >60)
GFR calc non Af Amer: >60 mL/min (ref >60)
Glucose, Bld: 88 mg/dL (ref 70–99)
Potassium: 3.4 mmol/L — ABNORMAL LOW (ref 3.5–5.1)
Sodium: 135 mmol/L (ref 135–145)
Total Bilirubin: 0.4 mg/dL (ref 0.3–1.2)
Total Protein: 6.2 g/dL — ABNORMAL LOW (ref 6.5–8.1)

## 2019-06-17 LAB — CBC
HCT: 35.6 % — ABNORMAL LOW (ref 36.0–46.0)
Hemoglobin: 12.3 g/dL (ref 12.0–15.0)
MCH: 31.2 pg (ref 26.0–34.0)
MCHC: 34.6 g/dL (ref 30.0–36.0)
MCV: 90.4 fL (ref 80.0–100.0)
Platelets: 285 10*3/uL (ref 150–400)
RBC: 3.94 MIL/uL (ref 3.87–5.11)
RDW: 13.2 % (ref 11.5–15.5)
WBC: 15.8 10*3/uL — ABNORMAL HIGH (ref 4.0–10.5)
nRBC: 0 % (ref 0.0–0.2)

## 2019-06-17 LAB — PROTEIN / CREATININE RATIO, URINE
Creatinine, Urine: 97.4 mg/dL
Protein Creatinine Ratio: 0.15 mg/mg{Cre} (ref 0.00–0.15)
Total Protein, Urine: 15 mg/dL

## 2019-06-17 MED ORDER — LABETALOL HCL 5 MG/ML IV SOLN
20.0000 mg | INTRAVENOUS | Status: DC | PRN
  Administered 2019-06-17: 20 mg via INTRAVENOUS

## 2019-06-17 MED ORDER — BETAMETHASONE SOD PHOS & ACET 6 (3-3) MG/ML IJ SUSP
12.0000 mg | Freq: Once | INTRAMUSCULAR | Status: AC
  Administered 2019-06-17: 21:00:00 12 mg via INTRAMUSCULAR
  Filled 2019-06-17: qty 2

## 2019-06-17 MED ORDER — LABETALOL HCL 5 MG/ML IV SOLN: 80.0000 mg | INTRAVENOUS | Status: DC | PRN

## 2019-06-17 MED ORDER — LABETALOL HCL 5 MG/ML IV SOLN
INTRAVENOUS | Status: AC
  Filled 2019-06-17: qty 4

## 2019-06-17 MED ORDER — NIFEDIPINE ER 30 MG PO TB24: 30.0000 mg | ORAL_TABLET | Freq: Two times a day (BID) | ORAL | 0 refills | Status: DC

## 2019-06-17 MED ORDER — LABETALOL HCL 5 MG/ML IV SOLN: 40.0000 mg | INTRAVENOUS | Status: DC | PRN

## 2019-06-17 MED ORDER — HYDRALAZINE HCL 20 MG/ML IJ SOLN: 10.0000 mg | INTRAMUSCULAR | Status: DC | PRN

## 2019-06-17 NOTE — Discharge Instructions (Signed)
Preeclampsia and Eclampsia °Preeclampsia is a serious condition that may develop during pregnancy. This condition causes high blood pressure and increased protein in your urine along with other symptoms, such as headaches and vision changes. These symptoms may develop as the condition gets worse. Preeclampsia may occur at 20 weeks of pregnancy or later. °Diagnosing and treating preeclampsia early is very important. If not treated early, it can cause serious problems for you and your baby. One problem it can lead to is eclampsia. Eclampsia is a condition that causes muscle jerking or shaking (convulsions or seizures) and other serious problems for the mother. During pregnancy, delivering your baby may be the best treatment for preeclampsia or eclampsia. For most women, preeclampsia and eclampsia symptoms go away after giving birth. °In rare cases, a woman may develop preeclampsia after giving birth (postpartum preeclampsia). This usually occurs within 48 hours after childbirth but may occur up to 6 weeks after giving birth. °What are the causes? °The cause of preeclampsia is not known. °What increases the risk? °The following risk factors make you more likely to develop preeclampsia: °· Being pregnant for the first time. °· Having had preeclampsia during a past pregnancy. °· Having a family history of preeclampsia. °· Having high blood pressure. °· Being pregnant with more than one baby. °· Being 35 or older. °· Being African-American. °· Having kidney disease or diabetes. °· Having medical conditions such as lupus or blood diseases. °· Being very overweight (obese). °What are the signs or symptoms? °The most common symptoms are: °· Severe headaches. °· Vision problems, such as blurred or double vision. °· Abdominal pain, especially upper abdominal pain. °Other symptoms that may develop as the condition gets worse include: °· Sudden weight gain. °· Sudden swelling of the hands, face, legs, and feet. °· Severe nausea  and vomiting. °· Numbness in the face, arms, legs, and feet. °· Dizziness. °· Urinating less than usual. °· Slurred speech. °· Convulsions or seizures. °How is this diagnosed? °There are no screening tests for preeclampsia. Your health care provider will ask you about symptoms and check for signs of preeclampsia during your prenatal visits. You may also have tests that include: °· Checking your blood pressure. °· Urine tests to check for protein. Your health care provider will check for this at every prenatal visit. °· Blood tests. °· Monitoring your baby's heart rate. °· Ultrasound. °How is this treated? °You and your health care provider will determine the treatment approach that is best for you. Treatment may include: °· Having more frequent prenatal exams to check for signs of preeclampsia, if you have an increased risk for preeclampsia. °· Medicine to lower your blood pressure. °· Staying in the hospital, if your condition is severe. There, treatment will focus on controlling your blood pressure and the amount of fluids in your body (fluid retention). °· Taking medicine (magnesium sulfate) to prevent seizures. This may be given as an injection or through an IV. °· Taking a low-dose aspirin during your pregnancy. °· Delivering your baby early. You may have your labor started with medicine (induced), or you may have a cesarean delivery. °Follow these instructions at home: °Eating and drinking ° °· Drink enough fluid to keep your urine pale yellow. °· Avoid caffeine. °Lifestyle °· Do not use any products that contain nicotine or tobacco, such as cigarettes and e-cigarettes. If you need help quitting, ask your health care provider. °· Do not use alcohol or drugs. °· Avoid stress as much as possible. Rest and get   plenty of sleep. °General instructions °· Take over-the-counter and prescription medicines only as told by your health care provider. °· When lying down, lie on your left side. This keeps pressure off your  major blood vessels. °· When sitting or lying down, raise (elevate) your feet. Try putting some pillows underneath your lower legs. °· Exercise regularly. Ask your health care provider what kinds of exercise are best for you. °· Keep all follow-up and prenatal visits as told by your health care provider. This is important. °How is this prevented? °There is no known way of preventing preeclampsia or eclampsia from developing. However, to lower your risk of complications and detect problems early: °· Get regular prenatal care. Your health care provider may be able to diagnose and treat the condition early. °· Maintain a healthy weight. Ask your health care provider for help managing weight gain during pregnancy. °· Work with your health care provider to manage any long-term (chronic) health conditions you have, such as diabetes or kidney problems. °· You may have tests of your blood pressure and kidney function after giving birth. °· Your health care provider may have you take low-dose aspirin during your next pregnancy. °Contact a health care provider if: °· You have symptoms that your health care provider told you may require more treatment or monitoring, such as: °? Headaches. °? Nausea or vomiting. °? Abdominal pain. °? Dizziness. °? Light-headedness. °Get help right away if: °· You have severe: °? Abdominal pain. °? Headaches that do not get better. °? Dizziness. °? Vision problems. °? Confusion. °? Nausea or vomiting. °· You have any of the following: °? A seizure. °? Sudden, rapid weight gain. °? Sudden swelling in your hands, ankles, or face. °? Trouble moving any part of your body. °? Numbness in any part of your body. °? Trouble speaking. °? Abnormal bleeding. °· You faint. °Summary °· Preeclampsia is a serious condition that may develop during pregnancy. °· This condition causes high blood pressure and increased protein in your urine along with other symptoms, such as headaches and vision  changes. °· Diagnosing and treating preeclampsia early is very important. If not treated early, it can cause serious problems for you and your baby. °· Get help right away if you have symptoms that your health care provider told you to watch for. °This information is not intended to replace advice given to you by your health care provider. Make sure you discuss any questions you have with your health care provider. °Document Released: 09/01/2000 Document Revised: 05/07/2018 Document Reviewed: 04/10/2016 °Elsevier Patient Education © 2020 [employer] ° °

## 2019-06-17 NOTE — MAU Note (Signed)
Presents for BP evaluation.  Was seen earlier today in office, instructed to go home and retake BP and if elevated to go MAU for eval.  Reports ^BPx3 while at home.  Reports mild H/A & seeing spots.  Denies epigastric pain.  Denies VB or LOF.  Endorses +FM.

## 2019-06-17 NOTE — MAU Provider Note (Signed)
Chief Complaint  Patient presents with  . BP Evaluation     First Provider Initiated Contact with Patient 06/17/19 1818      S: Emily Delgado  is a 23 y.o. y.o. year old G19P0000 female at [redacted]w[redacted]d weeks gestation who was sents to MAU with severely elevated blood pressures at home 170's systolic. Pos Hx CHTN on Procardia XL 60 mg QD. Took dose this morning. Has had few severely elevated BPs prior to this per prenatal record.   Associated symptoms: Intermittent mild Headaches, none now, Pos vision changes (seeing spots) x 1 week, Denies epigastric pain Contractions: Denies Vaginal bleeding: Denies  Fetal movement: Nml Pedal edema increased over past week.   O:  Patient Vitals for the past 24 hrs:  BP Temp Temp src Pulse Resp SpO2 Height Weight  06/17/19 2000 127/69 - - 98 - - - -  06/17/19 1945 135/68 - - 96 - - - -  06/17/19 1930 133/76 - - 96 - - - -  06/17/19 1915 133/69 - - 96 - - - -  06/17/19 1900 130/76 - - 99 - - - -  06/17/19 1845 128/83 - - 98 - - - -  06/17/19 1830 138/85 - - (!) 104 - - - -  06/17/19 1815 139/86 - - (!) 102 - - - -  06/17/19 1800 (!) 163/104 - - (!) 118 - - - -  06/17/19 1749 (!) 155/95 - - (!) 122 - - - -  06/17/19 1735 (!) 160/95 98.3 F (36.8 C) Oral (!) 126 20 97 % - -  06/17/19 1730 - - - - - - 5\' 3"  (1.6 m) 99.8 kg   General: NAD Heart: Regular rate Lungs: Normal rate and effort Abd: Soft, NT, Gravid, S=D Extremities: 1+ Pedal edema Neuro: 3+ deep tendon reflexes, 1 beat clonus bilat Pelvic: Deferred     EFM: 145, Moderate variability, 15 x 15 accelerations, no decelerations Toco: UI  Results for orders placed or performed during the hospital encounter of 06/17/19 (from the past 24 hour(s))  Comprehensive metabolic panel     Status: Abnormal   Collection Time: 06/17/19  5:58 PM  Result Value Ref Range   Sodium 135 135 - 145 mmol/L   Potassium 3.4 (L) 3.5 - 5.1 mmol/L   Chloride 103 98 - 111 mmol/L   CO2 21 (L) 22 - 32 mmol/L    Glucose, Bld 88 70 - 99 mg/dL   BUN 11 6 - 20 mg/dL   Creatinine, Ser 06/19/19 0.44 - 1.00 mg/dL   Calcium 9.0 8.9 - 5.46 mg/dL   Total Protein 6.2 (L) 6.5 - 8.1 g/dL   Albumin 2.7 (L) 3.5 - 5.0 g/dL   AST 16 15 - 41 U/L   ALT 14 0 - 44 U/L   Alkaline Phosphatase 117 38 - 126 U/L   Total Bilirubin 0.4 0.3 - 1.2 mg/dL   GFR calc non Af Amer >60 >60 mL/min   GFR calc Af Amer >60 >60 mL/min   Anion gap 11 5 - 15  CBC     Status: Abnormal   Collection Time: 06/17/19  5:58 PM  Result Value Ref Range   WBC 15.8 (H) 4.0 - 10.5 K/uL   RBC 3.94 3.87 - 5.11 MIL/uL   Hemoglobin 12.3 12.0 - 15.0 g/dL   HCT 06/19/19 (L) 12.7 - 51.7 %   MCV 90.4 80.0 - 100.0 fL   MCH 31.2 26.0 - 34.0 pg   MCHC  34.6 30.0 - 36.0 g/dL   RDW 13.2 11.5 - 15.5 %   Platelets 285 150 - 400 K/uL   nRBC 0.0 0.0 - 0.2 %  Protein / creatinine ratio, urine     Status: None   Collection Time: 06/17/19  5:58 PM  Result Value Ref Range   Creatinine, Urine 97.40 mg/dL   Total Protein, Urine 15 mg/dL   Protein Creatinine Ratio 0.15 0.00 - 0.15 mg/mg[Cre]  Urinalysis, Routine w reflex microscopic     Status: Abnormal   Collection Time: 06/17/19  6:00 PM  Result Value Ref Range   Color, Urine YELLOW YELLOW   APPearance HAZY (A) CLEAR   Specific Gravity, Urine 1.020 1.005 - 1.030   pH 6.0 5.0 - 8.0   Glucose, UA NEGATIVE NEGATIVE mg/dL   Hgb urine dipstick NEGATIVE NEGATIVE   Bilirubin Urine NEGATIVE NEGATIVE   Ketones, ur NEGATIVE NEGATIVE mg/dL   Protein, ur NEGATIVE NEGATIVE mg/dL   Nitrite NEGATIVE NEGATIVE   Leukocytes,Ua TRACE (A) NEGATIVE   RBC / HPF 0-5 0 - 5 RBC/hpf   WBC, UA 0-5 0 - 5 WBC/hpf   Bacteria, UA RARE (A) NONE SEEN   Squamous Epithelial / LPF 6-10 0 - 5   Mucus PRESENT    MAU Course/MDM Labetalol 20 mg IV given for second severe-range BP. BP Normalized afterwards. Mild tachycardia initially resolved w/ PO fluids. Not seeing spots currently.    Discussed Hx, labs, BPs, exam, vision changes w/ Dr.  Nehemiah Settle. Not significant change from previous BPs. New orders: Recommends splitting Procardia XL 30 BID. OK for D/C home. Discussed F/U w/ Dr. Ronita Hipps. BMZ now and Pt to come to [facility] tomorrow for BP and second BMZ.   Explained to pt that she may be developing superimposed Pre-E and should be followed very closely. May need to be delivered early.   A: [redacted]w[redacted]d week IUP Chronic hypertension with possible superimposed preeclampsia FHR reactive  P: Discharge home in stable condition per consult with Dr. Nehemiah Settle.  Preeclampsia precautions. Follow-up for blood pressure check in 1 day at your doctor's office sooner as needed if symptoms worsen. Return to maternity admissions as needed in [place], [place], [place] 06/17/2019 6:43 PM

## 2019-06-18 DIAGNOSIS — Z3A33 33 weeks gestation of pregnancy: Secondary | ICD-10-CM | POA: Diagnosis not present

## 2019-06-18 DIAGNOSIS — N898 Other specified noninflammatory disorders of vagina: Secondary | ICD-10-CM | POA: Diagnosis not present

## 2019-06-18 DIAGNOSIS — O10013 Pre-existing essential hypertension complicating pregnancy, third trimester: Secondary | ICD-10-CM | POA: Diagnosis not present

## 2019-06-20 DIAGNOSIS — O10013 Pre-existing essential hypertension complicating pregnancy, third trimester: Secondary | ICD-10-CM | POA: Diagnosis not present

## 2019-06-20 DIAGNOSIS — Z3A34 34 weeks gestation of pregnancy: Secondary | ICD-10-CM | POA: Diagnosis not present

## 2019-06-23 DIAGNOSIS — O133 Gestational [pregnancy-induced] hypertension without significant proteinuria, third trimester: Secondary | ICD-10-CM | POA: Diagnosis not present

## 2019-06-23 DIAGNOSIS — Z3A34 34 weeks gestation of pregnancy: Secondary | ICD-10-CM | POA: Diagnosis not present

## 2019-06-27 DIAGNOSIS — O133 Gestational [pregnancy-induced] hypertension without significant proteinuria, third trimester: Secondary | ICD-10-CM | POA: Diagnosis not present

## 2019-06-27 DIAGNOSIS — Z3685 Encounter for antenatal screening for Streptococcus B: Secondary | ICD-10-CM | POA: Diagnosis not present

## 2019-06-27 DIAGNOSIS — Z3A35 35 weeks gestation of pregnancy: Secondary | ICD-10-CM | POA: Diagnosis not present

## 2019-07-01 ENCOUNTER — Telehealth (HOSPITAL_COMMUNITY): Payer: Self-pay | Admitting: *Deleted

## 2019-07-01 ENCOUNTER — Encounter (HOSPITAL_COMMUNITY): Payer: Self-pay | Admitting: *Deleted

## 2019-07-01 DIAGNOSIS — Z3A35 35 weeks gestation of pregnancy: Secondary | ICD-10-CM | POA: Diagnosis not present

## 2019-07-01 DIAGNOSIS — O133 Gestational [pregnancy-induced] hypertension without significant proteinuria, third trimester: Secondary | ICD-10-CM | POA: Diagnosis not present

## 2019-07-01 DIAGNOSIS — O10919 Unspecified pre-existing hypertension complicating pregnancy, unspecified trimester: Secondary | ICD-10-CM | POA: Diagnosis not present

## 2019-07-01 NOTE — Telephone Encounter (Signed)
Preadmission screen  

## 2019-07-04 DIAGNOSIS — Z3A36 36 weeks gestation of pregnancy: Secondary | ICD-10-CM | POA: Diagnosis not present

## 2019-07-04 DIAGNOSIS — O10913 Unspecified pre-existing hypertension complicating pregnancy, third trimester: Secondary | ICD-10-CM | POA: Diagnosis not present

## 2019-07-08 DIAGNOSIS — O10913 Unspecified pre-existing hypertension complicating pregnancy, third trimester: Secondary | ICD-10-CM | POA: Diagnosis not present

## 2019-07-08 DIAGNOSIS — O10919 Unspecified pre-existing hypertension complicating pregnancy, unspecified trimester: Secondary | ICD-10-CM | POA: Diagnosis not present

## 2019-07-08 DIAGNOSIS — Z3A36 36 weeks gestation of pregnancy: Secondary | ICD-10-CM | POA: Diagnosis not present

## 2019-07-09 ENCOUNTER — Other Ambulatory Visit: Payer: Self-pay

## 2019-07-09 ENCOUNTER — Other Ambulatory Visit (HOSPITAL_COMMUNITY)
Admission: RE | Admit: 2019-07-09 | Discharge: 2019-07-09 | Disposition: A | Discharge status: Home / Self Care | Payer: BC Managed Care – PPO | Source: Ambulatory Visit | Attending: Obstetrics | Admitting: Obstetrics

## 2019-07-09 ENCOUNTER — Other Ambulatory Visit: Payer: Self-pay | Admitting: Obstetrics

## 2019-07-09 DIAGNOSIS — Z01812 Encounter for preprocedural laboratory examination: Secondary | ICD-10-CM | POA: Diagnosis not present

## 2019-07-09 DIAGNOSIS — Z20828 Contact with and (suspected) exposure to other viral communicable diseases: Secondary | ICD-10-CM | POA: Diagnosis not present

## 2019-07-09 LAB — SARS CORONAVIRUS 2 BY RT PCR (HOSPITAL ORDER, PERFORMED IN ~~LOC~~ HOSPITAL LAB): SARS Coronavirus 2: NEGATIVE

## 2019-07-09 NOTE — MAU Note (Signed)
Asymptomatic, swab collected. 

## 2019-07-10 NOTE — H&P (Signed)
Emily Delgado is a 23 y.o. G1P0000 at [redacted]w[redacted]d presenting for IOL due to worsening chronic htn. Pt notes rare contractions . Good fetal movement, No vaginal bleeding, not leaking fluid.  P[facility] at [facility] since 7 wks - Dated by LMP c/w 7 wk u/s - asthma, on inhalers -chronic htn, bps elevated in 1st trimester and diagnosis made. Nl baseline PIH lab, started on baby ASA and 30mg  Procardia. Acute elevations in bp at 33wks, BMZ completed, labs, sx and bps remained elevated but stable since then on increased doses of Procardia 28m XL qday.  - Anxiety, on Buspar - AGA - GBS neg   Prenatal Transfer Tool  Maternal Diabetes: No Genetic Screening: Normal Maternal Ultrasounds/Referrals: Normal Fetal Ultrasounds or other Referrals:  None Maternal Substance Abuse:  No Significant Maternal Medications:  None Significant Maternal Lab Results: Group B Strep negative     OB History    Gravida  1   Para  0   Term  0   Preterm  0   AB  0   Living  0     SAB  0   TAB  0   Ectopic  0   Multiple  0   Live Births             Past Medical History:  Diagnosis Date  . Asthma   . Hypertension    Past Surgical History:  Procedure Laterality Date  . BREAST REDUCTION SURGERY     09/04/17   Family History: family history is not on file. Social History:  reports that she has never smoked. She has never used smokeless tobacco. She reports that she does not drink alcohol or use drugs.  Review of Systems - Negative except discomfort of pregnancy  There were no vitals filed for this visit.  Prenatal labs: ABO, Rh: O/Negative/-- (04/08 0000) Antibody: Negative (04/08 0000) Rubella: Immune (04/08 0000) RPR: Nonreactive (04/08 0000)  HBsAg: Negative (04/08 0000)  HIV: Non-reactive (04/08 0000)  GBS:   neg 1 hr Glucola 122  Genetic screening nl quad Anatomy US normal   Assessment/Plan: 23 y.o. G1P0000 at [redacted]w[redacted]d - chronic htn with gestational exacerbation,  possible PEC. Check labs on admit- CMP/ UA/ CBC. Defer Mag as long as no sx. Hold procardia in labor - IOL. cytotec x 1 then reasssess GBS neg Asthma, declined flu shot.  Anxiety, OK to cont Buspar.    Ala Dach 07/10/2019, 11:34 PM  Ala Dach 07/11/2019 8:07 AM

## 2019-07-11 ENCOUNTER — Other Ambulatory Visit: Payer: Self-pay

## 2019-07-11 ENCOUNTER — Encounter (HOSPITAL_COMMUNITY): Payer: Self-pay | Admitting: *Deleted

## 2019-07-11 ENCOUNTER — Inpatient Hospital Stay (HOSPITAL_COMMUNITY): Payer: BC Managed Care – PPO | Admitting: Anesthesiology

## 2019-07-11 ENCOUNTER — Inpatient Hospital Stay (HOSPITAL_COMMUNITY): Payer: BC Managed Care – PPO

## 2019-07-11 ENCOUNTER — Inpatient Hospital Stay (HOSPITAL_COMMUNITY)
Admission: AD | Admit: 2019-07-11 | Discharge: 2019-07-15 | DRG: 787 | Disposition: A | Discharge status: Home / Self Care | Payer: BC Managed Care – PPO | Attending: Obstetrics | Admitting: Obstetrics

## 2019-07-11 DIAGNOSIS — O9081 Anemia of the puerperium: Secondary | ICD-10-CM | POA: Diagnosis not present

## 2019-07-11 DIAGNOSIS — O133 Gestational [pregnancy-induced] hypertension without significant proteinuria, third trimester: Secondary | ICD-10-CM | POA: Diagnosis not present

## 2019-07-11 DIAGNOSIS — O26893 Other specified pregnancy related conditions, third trimester: Secondary | ICD-10-CM | POA: Diagnosis not present

## 2019-07-11 DIAGNOSIS — O9952 Diseases of the respiratory system complicating childbirth: Secondary | ICD-10-CM | POA: Diagnosis not present

## 2019-07-11 DIAGNOSIS — D62 Acute posthemorrhagic anemia: Secondary | ICD-10-CM | POA: Diagnosis not present

## 2019-07-11 DIAGNOSIS — Z3490 Encounter for supervision of normal pregnancy, unspecified, unspecified trimester: Secondary | ICD-10-CM | POA: Diagnosis present

## 2019-07-11 DIAGNOSIS — Z3A37 37 weeks gestation of pregnancy: Secondary | ICD-10-CM | POA: Diagnosis not present

## 2019-07-11 DIAGNOSIS — O1092 Unspecified pre-existing hypertension complicating childbirth: Secondary | ICD-10-CM | POA: Diagnosis not present

## 2019-07-11 DIAGNOSIS — O1002 Pre-existing essential hypertension complicating childbirth: Secondary | ICD-10-CM | POA: Diagnosis not present

## 2019-07-11 DIAGNOSIS — F419 Anxiety disorder, unspecified: Secondary | ICD-10-CM | POA: Diagnosis not present

## 2019-07-11 DIAGNOSIS — O134 Gestational [pregnancy-induced] hypertension without significant proteinuria, complicating childbirth: Secondary | ICD-10-CM | POA: Diagnosis not present

## 2019-07-11 DIAGNOSIS — J45909 Unspecified asthma, uncomplicated: Secondary | ICD-10-CM | POA: Diagnosis not present

## 2019-07-11 DIAGNOSIS — Z6791 Unspecified blood type, Rh negative: Secondary | ICD-10-CM

## 2019-07-11 DIAGNOSIS — O99344 Other mental disorders complicating childbirth: Secondary | ICD-10-CM | POA: Diagnosis not present

## 2019-07-11 DIAGNOSIS — I10 Essential (primary) hypertension: Secondary | ICD-10-CM | POA: Diagnosis present

## 2019-07-11 DIAGNOSIS — O119 Pre-existing hypertension with pre-eclampsia, unspecified trimester: Secondary | ICD-10-CM

## 2019-07-11 LAB — CBC WITH DIFFERENTIAL/PLATELET
Abs Immature Granulocytes: 0.08 10*3/uL — ABNORMAL HIGH (ref 0.00–0.07)
Basophils Absolute: 0 10*3/uL (ref 0.0–0.1)
Basophils Relative: 0 %
Eosinophils Absolute: 0 10*3/uL (ref 0.0–0.5)
Eosinophils Relative: 0 %
HCT: 38.5 % (ref 36.0–46.0)
Hemoglobin: 12.7 g/dL (ref 12.0–15.0)
Immature Granulocytes: 1 %
Lymphocytes Relative: 13 %
Lymphs Abs: 1.9 10*3/uL (ref 0.7–4.0)
MCH: 30 pg (ref 26.0–34.0)
MCHC: 33 g/dL (ref 30.0–36.0)
MCV: 91 fL (ref 80.0–100.0)
Monocytes Absolute: 1 10*3/uL (ref 0.1–1.0)
Monocytes Relative: 7 %
Neutro Abs: 11.4 10*3/uL — ABNORMAL HIGH (ref 1.7–7.7)
Neutrophils Relative %: 79 %
Platelets: 239 10*3/uL (ref 150–400)
RBC: 4.23 MIL/uL (ref 3.87–5.11)
RDW: 13.5 % (ref 11.5–15.5)
WBC: 14.3 10*3/uL — ABNORMAL HIGH (ref 4.0–10.5)
nRBC: 0 % (ref 0.0–0.2)

## 2019-07-11 LAB — COMPREHENSIVE METABOLIC PANEL
ALT: 15 U/L (ref 0–44)
AST: 14 U/L — ABNORMAL LOW (ref 15–41)
Albumin: 2.4 g/dL — ABNORMAL LOW (ref 3.5–5.0)
Alkaline Phosphatase: 118 U/L (ref 38–126)
Anion gap: 11 (ref 5–15)
BUN: 7 mg/dL (ref 6–20)
CO2: 21 mmol/L — ABNORMAL LOW (ref 22–32)
Calcium: 8.5 mg/dL — ABNORMAL LOW (ref 8.9–10.3)
Chloride: 104 mmol/L (ref 98–111)
Creatinine, Ser: 0.67 mg/dL (ref 0.44–1.00)
GFR calc Af Amer: >60 mL/min (ref >60)
GFR calc non Af Amer: >60 mL/min (ref >60)
Glucose, Bld: 77 mg/dL (ref 70–99)
Potassium: 3.9 mmol/L (ref 3.5–5.1)
Sodium: 136 mmol/L (ref 135–145)
Total Bilirubin: 0.2 mg/dL — ABNORMAL LOW (ref 0.3–1.2)
Total Protein: 5.3 g/dL — ABNORMAL LOW (ref 6.5–8.1)

## 2019-07-11 LAB — CBC
HCT: 38.4 % (ref 36.0–46.0)
Hemoglobin: 12.5 g/dL (ref 12.0–15.0)
MCH: 29.8 pg (ref 26.0–34.0)
MCHC: 32.6 g/dL (ref 30.0–36.0)
MCV: 91.4 fL (ref 80.0–100.0)
Platelets: 255 10*3/uL (ref 150–400)
RBC: 4.2 MIL/uL (ref 3.87–5.11)
RDW: 13.5 % (ref 11.5–15.5)
WBC: 12.6 10*3/uL — ABNORMAL HIGH (ref 4.0–10.5)
nRBC: 0 % (ref 0.0–0.2)

## 2019-07-11 LAB — TYPE AND SCREEN
ABO/RH(D): O NEG
Antibody Screen: NEGATIVE

## 2019-07-11 LAB — ABO/RH: ABO/RH(D): O NEG

## 2019-07-11 LAB — RPR: RPR Ser Ql: NONREACTIVE

## 2019-07-11 LAB — URIC ACID: Uric Acid, Serum: 4.2 mg/dL (ref 2.5–7.1)

## 2019-07-11 MED ORDER — MISOPROSTOL 25 MCG QUARTER TABLET
25.0000 ug | ORAL_TABLET | ORAL | Status: DC | PRN
  Administered 2019-07-11: 25 ug via VAGINAL
  Filled 2019-07-11: qty 1

## 2019-07-11 MED ORDER — PHENYLEPHRINE 40 MCG/ML (10ML) SYRINGE FOR IV PUSH (FOR BLOOD PRESSURE SUPPORT): 80.0000 ug | PREFILLED_SYRINGE | INTRAVENOUS | Status: DC | PRN

## 2019-07-11 MED ORDER — EPHEDRINE 5 MG/ML INJ: 10.0000 mg | INTRAVENOUS | Status: DC | PRN

## 2019-07-11 MED ORDER — OXYTOCIN BOLUS FROM INFUSION: 500.0000 mL | Freq: Once | INTRAVENOUS | Status: DC

## 2019-07-11 MED ORDER — LACTATED RINGERS IV SOLN: 500.0000 mL | Freq: Once | INTRAVENOUS | Status: DC

## 2019-07-11 MED ORDER — SOD CITRATE-CITRIC ACID 500-334 MG/5ML PO SOLN
30.0000 mL | ORAL | Status: DC | PRN
  Administered 2019-07-13: 30 mL via ORAL
  Filled 2019-07-11: qty 30

## 2019-07-11 MED ORDER — LACTATED RINGERS IV SOLN
INTRAVENOUS | Status: DC
  Administered 2019-07-11 – 2019-07-13 (×6): via INTRAVENOUS

## 2019-07-11 MED ORDER — SODIUM CHLORIDE (PF) 0.9 % IJ SOLN
INTRAMUSCULAR | Status: DC | PRN
  Administered 2019-07-11: 12 mL/h via EPIDURAL

## 2019-07-11 MED ORDER — OXYTOCIN 40 UNITS IN NORMAL SALINE INFUSION - SIMPLE MED: 2.5000 [IU]/h | INTRAVENOUS | Status: DC

## 2019-07-11 MED ORDER — ACETAMINOPHEN 325 MG PO TABS: 650.0000 mg | ORAL_TABLET | ORAL | Status: DC | PRN

## 2019-07-11 MED ORDER — FENTANYL CITRATE (PF) 100 MCG/2ML IJ SOLN
100.0000 ug | INTRAMUSCULAR | Status: DC | PRN
  Administered 2019-07-11: 18:00:00 100 ug via INTRAVENOUS
  Filled 2019-07-11 (×2): qty 2

## 2019-07-11 MED ORDER — FENTANYL-BUPIVACAINE-NACL 0.5-0.125-0.9 MG/250ML-% EP SOLN
12.0000 mL/h | EPIDURAL | Status: DC | PRN
  Administered 2019-07-12: 15:00:00 12 mL/h via EPIDURAL
  Filled 2019-07-11 (×2): qty 250

## 2019-07-11 MED ORDER — LIDOCAINE HCL (PF) 1 % IJ SOLN
INTRAMUSCULAR | Status: DC | PRN
  Administered 2019-07-11 (×2): 4 mL via EPIDURAL

## 2019-07-11 MED ORDER — LIDOCAINE HCL (PF) 1 % IJ SOLN: 30.0000 mL | INTRAMUSCULAR | Status: DC | PRN

## 2019-07-11 MED ORDER — LACTATED RINGERS IV SOLN: 500.0000 mL | INTRAVENOUS | Status: DC | PRN

## 2019-07-11 MED ORDER — ONDANSETRON HCL 4 MG/2ML IJ SOLN
4.0000 mg | Freq: Four times a day (QID) | INTRAMUSCULAR | Status: DC | PRN
  Administered 2019-07-12: 14:00:00 4 mg via INTRAVENOUS
  Filled 2019-07-11: qty 2

## 2019-07-11 MED ORDER — DIPHENHYDRAMINE HCL 50 MG/ML IJ SOLN: 12.5000 mg | INTRAMUSCULAR | Status: DC | PRN

## 2019-07-11 MED ORDER — TERBUTALINE SULFATE 1 MG/ML IJ SOLN: 0.2500 mg | Freq: Once | INTRAMUSCULAR | Status: DC | PRN

## 2019-07-11 MED ORDER — OXYTOCIN 40 UNITS IN NORMAL SALINE INFUSION - SIMPLE MED
1.0000 m[IU]/min | INTRAVENOUS | Status: DC
  Administered 2019-07-11: 13:00:00 2 m[IU]/min via INTRAVENOUS
  Filled 2019-07-11: qty 1000

## 2019-07-11 NOTE — Progress Notes (Signed)
S: Doing well, no complaints, pain increasing and planning epidural. No HA  O: BP (!) 142/85   Pulse (!) 107   Temp 98.5 F (36.9 C) (Oral)   Resp 16   Ht 5\' 3"  (1.6 m)   Wt 100.2 kg   SpO2 97%   BMI 39.15 kg/m    FHT:  FHR: 130s bpm, variability: moderate,  accelerations:  Present,  decelerations:  Absent UC:   regular, every 3 minutes, not always tracing well SVE:   Dilation: 2 Effacement (%): Thick Station: -3 Exam by:: Dr. Reola Mosher now out, repeat exam pending by RN post-epidural  A / P:  23 y.o.  OB History  Gravida Para Term Preterm AB Living  1 0 0 0 0 0  SAB TAB Ectopic Multiple Live Births  0 0 0 0 0   at [redacted]w[redacted]d IOL for gest on chronic htn, slow progress in latent phase, continue pitocin  Fetal Wellbeing:  Category I Pain Control:  Epidural  Anticipated MOD:  NSVD  Ala Dach 07/11/2019, 8:52 PM

## 2019-07-11 NOTE — Anesthesia Procedure Notes (Addendum)
Epidural Patient location during procedure: OB Start time: 07/11/2019 8:25 PM End time: 07/11/2019 8:32 PM  Staffing Anesthesiologist: Brennan Bailey, MD Performed: anesthesiologist   Preanesthetic Checklist Completed: patient identified, pre-op evaluation, timeout performed, IV checked, risks and benefits discussed and monitors and equipment checked  Epidural Patient position: sitting Prep: site prepped and draped and DuraPrep Patient monitoring: continuous pulse ox, blood pressure and heart rate Approach: midline Location: L3-L4 Injection technique: LOR air  Needle:  Needle type: Tuohy  Needle gauge: 17 G Needle length: 15 cm Needle insertion depth: 9 (10) cm Catheter type: closed end flexible Catheter size: 19 Gauge Catheter at skin depth: 15 cm Test dose: negative and Other (1% lidocaine)  Assessment Events: blood not aspirated, injection not painful, no injection resistance, negative IV test and no paresthesia  Additional Notes Patient identified. Risks, benefits, and alternatives discussed with patient including but not limited to bleeding, infection, nerve damage, paralysis, failed block, incomplete pain control, headache, blood pressure changes, nausea, vomiting, reactions to medication, itching, and postpartum back pain. Confirmed with bedside nurse the patient's most recent platelet count. Confirmed with patient that they are not currently taking any anticoagulation, have any bleeding history, or any family history of bleeding disorders. Patient expressed understanding and wished to proceed. All questions were answered. Sterile technique was used throughout the entire procedure. Please see nursing notes for vital signs.   Somewhat difficult placement due to body habitus. 2 attempts with 9cm Tuohy unsuccessful. Next attempt with 15cm Tuohy at L3-4 with crisp LOR. Test dose was given through epidural catheter and negative prior to continuing to dose epidural or start  infusion. Warning signs of high block given to the patient including shortness of breath, tingling/numbness in hands, complete motor block, or any concerning symptoms with instructions to call for help. Patient was given instructions on fall risk and not to get out of bed. All questions and concerns addressed with instructions to call with any issues or inadequate analgesia.  Reason for block:procedure for pain

## 2019-07-11 NOTE — Progress Notes (Signed)
S: Doing well, no complaints, pain well controlled, planning epidural when more uncomfortable. cytotec x 1 then pitocin started around 1:30pm. Anxiety controlled. Feeling cramping.   O: BP (!) 157/79   Pulse 98   Temp 98.5 F (36.9 C) (Oral)   Resp 16   Ht 5\' 3"  (1.6 m)   Wt 100.2 kg   BMI 39.15 kg/m    FHT:  FHR: 130s bpm, variability: moderate,  accelerations:  Present,  decelerations:  Absent UC:   rare SVE:   Dilation: 2 Effacement (%): Thick Station: -3 Exam by:: Mabeline Caras, RN Cervical Foley placement: Consent obtained, vaginal exam confirmed vertex presentation.  Foley catheter threaded through cervix digitally inflated with 60 cc of water and placed on traction.  Patient tolerated well.  A / P:  23 y.o.  OB History  Gravida Para Term Preterm AB Living  1 0 0 0 0 0  SAB TAB Ectopic Multiple Live Births  0 0 0 0 0   at [redacted]w[redacted]d Worsening gestational hypertension on top of chronic hypertension.  Labs have been stable in the office.  Will repeat with next lab draw.  Induction of labor.  Cytotec x1.  Continue to increase Pitocin.  Cervical Foley placed.  AROM when able.  Fetal Wellbeing:  Category I Pain Control:  Labor support without medications  Anticipated MOD:  NSVD  Ala Dach 07/11/2019, 2:23 PM

## 2019-07-11 NOTE — Anesthesia Preprocedure Evaluation (Addendum)
Anesthesia Evaluation  Patient identified by MRN, date of birth, ID band Patient awake    Reviewed: Allergy & Precautions, Patient's Chart, lab work & pertinent test results  History of Anesthesia Complications Negative for: history of anesthetic complications  Airway Mallampati: II  TM Distance: >3 FB Neck ROM: Full    Dental no notable dental hx.    Pulmonary asthma ,    Pulmonary exam normal        Cardiovascular hypertension, Pt. on medications Normal cardiovascular exam     Neuro/Psych negative neurological ROS  negative psych ROS   GI/Hepatic negative GI ROS, Neg liver ROS,   Endo/Other  negative endocrine ROS  Renal/GU negative Renal ROS  negative genitourinary   Musculoskeletal negative musculoskeletal ROS (+)   Abdominal   Peds  Hematology negative hematology ROS (+)   Anesthesia Other Findings Day of surgery medications reviewed with patient.  Reproductive/Obstetrics (+) Pregnancy                             Anesthesia Physical Anesthesia Plan  ASA: II and emergent  Anesthesia Plan: Epidural   Post-op Pain Management:    Induction:   PONV Risk Score and Plan: 3 and Treatment may vary due to age or medical condition, Ondansetron and Dexamethasone  Airway Management Planned: Natural Airway  Additional Equipment:   Intra-op Plan:   Post-operative Plan:   Informed Consent: I have reviewed the patients History and Physical, chart, labs and discussed the procedure including the risks, benefits and alternatives for the proposed anesthesia with the patient or authorized representative who has indicated his/her understanding and acceptance.     Dental advisory given  Plan Discussed with: CRNA  Anesthesia Plan Comments: (Epidural to be used for unplanned C/S for failure to progress. Daiva Huge, MD)       Anesthesia Quick Evaluation

## 2019-07-12 MED ORDER — BUSPIRONE HCL 5 MG PO TABS
7.5000 mg | ORAL_TABLET | Freq: Two times a day (BID) | ORAL | Status: DC
  Administered 2019-07-12 – 2019-07-13 (×2): 7.5 mg via ORAL
  Filled 2019-07-12 (×3): qty 1

## 2019-07-12 MED ORDER — NIFEDIPINE ER OSMOTIC RELEASE 30 MG PO TB24
60.0000 mg | ORAL_TABLET | Freq: Every day | ORAL | Status: DC
  Administered 2019-07-12: 10:00:00 60 mg via ORAL
  Filled 2019-07-12 (×2): qty 1

## 2019-07-12 NOTE — Progress Notes (Signed)
S: Doing well, no complaints, pain well controlled well epidural, slept overnight  O: BP (!) 157/101   Pulse 100   Temp 98.3 F (36.8 C) (Oral)   Resp 18   Ht 5\' 3"  (1.6 m)   Wt 100.2 kg   SpO2 97%   BMI 39.15 kg/m    FHT:  FHR: 120s bpm, variability: moderate,  accelerations:  Present,  decelerations:  Absent UC:   regular, every 2-3 minutes, pit at 20 munits/ min SVE:   Dilation: 4 Effacement (%): 50 Station: -2 Exam by:: TTAustin RN AROM clear  A / P:  23 y.o.  OB History  Gravida Para Term Preterm AB Living  1 0 0 0 0 0  SAB TAB Ectopic Multiple Live Births  0 0 0 0 0   at [redacted]w[redacted]d IOL for worsening chronic htn, slow progress, AROM now, continue pitocin  Fetal Wellbeing:  Category I Pain Control:  Epidural  Anticipated MOD:  NSVD  Ala Dach 07/12/2019, 8:58 AM

## 2019-07-12 NOTE — Progress Notes (Signed)
S: Doing well, no complaints, pain well controlled with epidural  O: BP 130/67   Pulse 91   Temp 98.3 F (36.8 C) (Oral)   Resp 16   Ht 5\' 3"  (1.6 m)   Wt 100.2 kg   SpO2 97%   BMI 39.15 kg/m    FHT:  FHR: 140s bpm, variability: moderate,  accelerations:  Present,  decelerations:  Absent UC:   regular, every 2-3 minutes, pit at 20 mu/min SVE:   Dilation: 5 Effacement (%): 50 Station: -2 Exam by:: Serenitie Vinton  IUPC placed  A / P:  23 y.o.  OB History  Gravida Para Term Preterm AB Living  1 0 0 0 0 0  SAB TAB Ectopic Multiple Live Births  0 0 0 0 0   at [redacted]w[redacted]d Induction of labor at 37 weeks for worsening hypertension, no evidence of preeclampsia Slow progress, continue Pitocin. IUPC placed  Fetal Wellbeing:  Category I Pain Control:  Epidural  Anticipated MOD:  NSVD  Ala Dach 07/12/2019, 2:56 PM

## 2019-07-13 ENCOUNTER — Encounter (HOSPITAL_COMMUNITY): Payer: Self-pay | Admitting: *Deleted

## 2019-07-13 ENCOUNTER — Encounter (HOSPITAL_COMMUNITY)
Admission: AD | Disposition: A | Discharge status: Home / Self Care | Payer: Self-pay | Source: Home / Self Care | Attending: Obstetrics

## 2019-07-13 LAB — CBC WITH DIFFERENTIAL/PLATELET
Abs Immature Granulocytes: 0.07 10*3/uL (ref 0.00–0.07)
Basophils Absolute: 0 10*3/uL (ref 0.0–0.1)
Basophils Relative: 0 %
Eosinophils Absolute: 0 10*3/uL (ref 0.0–0.5)
Eosinophils Relative: 0 %
HCT: 38 % (ref 36.0–46.0)
Hemoglobin: 12.6 g/dL (ref 12.0–15.0)
Immature Granulocytes: 0 %
Lymphocytes Relative: 7 %
Lymphs Abs: 1.1 10*3/uL (ref 0.7–4.0)
MCH: 30.4 pg (ref 26.0–34.0)
MCHC: 33.2 g/dL (ref 30.0–36.0)
MCV: 91.6 fL (ref 80.0–100.0)
Monocytes Absolute: 1.1 10*3/uL — ABNORMAL HIGH (ref 0.1–1.0)
Monocytes Relative: 6 %
Neutro Abs: 14.9 10*3/uL — ABNORMAL HIGH (ref 1.7–7.7)
Neutrophils Relative %: 87 %
Platelets: 237 10*3/uL (ref 150–400)
RBC: 4.15 MIL/uL (ref 3.87–5.11)
RDW: 13.3 % (ref 11.5–15.5)
WBC: 17.3 10*3/uL — ABNORMAL HIGH (ref 4.0–10.5)
nRBC: 0 % (ref 0.0–0.2)

## 2019-07-13 SURGERY — Surgical Case
Anesthesia: Epidural | Site: Abdomen | Wound class: Clean Contaminated

## 2019-07-13 MED ORDER — CEFAZOLIN SODIUM-DEXTROSE 2-3 GM-%(50ML) IV SOLR
INTRAVENOUS | Status: DC | PRN
  Administered 2019-07-13: 2 g via INTRAVENOUS

## 2019-07-13 MED ORDER — MISOPROSTOL 200 MCG PO TABS
1000.0000 ug | ORAL_TABLET | Freq: Once | ORAL | Status: AC
  Administered 2019-07-13: 11:00:00 1000 ug via RECTAL

## 2019-07-13 MED ORDER — BUPIVACAINE HCL 0.5 % IJ SOLN
INTRAMUSCULAR | Status: DC | PRN
  Administered 2019-07-13: 30 mL

## 2019-07-13 MED ORDER — ACETAMINOPHEN 500 MG PO TABS: 1000.0000 mg | ORAL_TABLET | Freq: Once | ORAL | Status: DC

## 2019-07-13 MED ORDER — WITCH HAZEL-GLYCERIN EX PADS: 1.0000 "application " | MEDICATED_PAD | CUTANEOUS | Status: DC | PRN

## 2019-07-13 MED ORDER — COCONUT OIL OIL: 1.0000 "application " | TOPICAL_OIL | Status: DC | PRN

## 2019-07-13 MED ORDER — DIBUCAINE (PERIANAL) 1 % EX OINT: 1.0000 "application " | TOPICAL_OINTMENT | CUTANEOUS | Status: DC | PRN

## 2019-07-13 MED ORDER — ACETAMINOPHEN 160 MG/5ML PO SOLN: 1000.0000 mg | Freq: Once | ORAL | Status: DC

## 2019-07-13 MED ORDER — KETOROLAC TROMETHAMINE 30 MG/ML IJ SOLN: 30.0000 mg | Freq: Once | INTRAMUSCULAR | Status: DC

## 2019-07-13 MED ORDER — KETOROLAC TROMETHAMINE 30 MG/ML IJ SOLN
30.0000 mg | Freq: Four times a day (QID) | INTRAMUSCULAR | Status: DC | PRN
  Administered 2019-07-14: 03:00:00 30 mg via INTRAVENOUS
  Filled 2019-07-13: qty 1

## 2019-07-13 MED ORDER — LACTATED RINGERS IV SOLN: 500.0000 mL | Freq: Once | INTRAVENOUS | Status: DC

## 2019-07-13 MED ORDER — GUAIFENESIN ER 600 MG PO TB12
600.0000 mg | ORAL_TABLET | Freq: Two times a day (BID) | ORAL | Status: DC | PRN
  Administered 2019-07-13: 18:00:00 600 mg via ORAL
  Filled 2019-07-13 (×3): qty 1

## 2019-07-13 MED ORDER — ONDANSETRON HCL 4 MG/2ML IJ SOLN: 4.0000 mg | Freq: Three times a day (TID) | INTRAMUSCULAR | Status: DC | PRN

## 2019-07-13 MED ORDER — MENTHOL 3 MG MT LOZG: 1.0000 | LOZENGE | OROMUCOSAL | Status: DC | PRN

## 2019-07-13 MED ORDER — NALOXONE HCL 0.4 MG/ML IJ SOLN: 0.4000 mg | INTRAMUSCULAR | Status: DC | PRN

## 2019-07-13 MED ORDER — BUSPIRONE HCL 5 MG PO TABS
7.5000 mg | ORAL_TABLET | Freq: Two times a day (BID) | ORAL | Status: DC
  Administered 2019-07-13 – 2019-07-15 (×5): 7.5 mg via ORAL
  Filled 2019-07-13 (×5): qty 2

## 2019-07-13 MED ORDER — SIMETHICONE 80 MG PO CHEW
80.0000 mg | CHEWABLE_TABLET | Freq: Three times a day (TID) | ORAL | Status: DC
  Administered 2019-07-13 – 2019-07-15 (×7): 80 mg via ORAL
  Filled 2019-07-13 (×7): qty 1

## 2019-07-13 MED ORDER — SENNOSIDES-DOCUSATE SODIUM 8.6-50 MG PO TABS
2.0000 | ORAL_TABLET | ORAL | Status: DC
  Administered 2019-07-13 – 2019-07-15 (×2): 2 via ORAL
  Filled 2019-07-13 (×3): qty 2

## 2019-07-13 MED ORDER — SODIUM CHLORIDE 0.9% FLUSH: 3.0000 mL | INTRAVENOUS | Status: DC | PRN

## 2019-07-13 MED ORDER — MORPHINE SULFATE (PF) 0.5 MG/ML IJ SOLN
INTRAMUSCULAR | Status: AC
  Filled 2019-07-13: qty 10

## 2019-07-13 MED ORDER — ACETAMINOPHEN 500 MG PO TABS
1000.0000 mg | ORAL_TABLET | Freq: Four times a day (QID) | ORAL | Status: AC
  Administered 2019-07-13 – 2019-07-14 (×4): 1000 mg via ORAL
  Filled 2019-07-13 (×4): qty 2

## 2019-07-13 MED ORDER — KETOROLAC TROMETHAMINE 30 MG/ML IJ SOLN: 30.0000 mg | Freq: Four times a day (QID) | INTRAMUSCULAR | Status: DC | PRN

## 2019-07-13 MED ORDER — DIPHENHYDRAMINE HCL 25 MG PO CAPS: 25.0000 mg | ORAL_CAPSULE | Freq: Four times a day (QID) | ORAL | Status: DC | PRN

## 2019-07-13 MED ORDER — CEFAZOLIN SODIUM-DEXTROSE 2-4 GM/100ML-% IV SOLN
INTRAVENOUS | Status: AC
  Filled 2019-07-13: qty 100

## 2019-07-13 MED ORDER — NALBUPHINE HCL 10 MG/ML IJ SOLN
5.0000 mg | INTRAMUSCULAR | Status: DC | PRN
  Filled 2019-07-13: qty 0.5

## 2019-07-13 MED ORDER — BUPIVACAINE HCL (PF) 0.5 % IJ SOLN
INTRAMUSCULAR | Status: AC
  Filled 2019-07-13: qty 30

## 2019-07-13 MED ORDER — DIPHENHYDRAMINE HCL 25 MG PO CAPS: 25.0000 mg | ORAL_CAPSULE | ORAL | Status: DC | PRN

## 2019-07-13 MED ORDER — FENTANYL CITRATE (PF) 100 MCG/2ML IJ SOLN
INTRAMUSCULAR | Status: AC
  Filled 2019-07-13: qty 2

## 2019-07-13 MED ORDER — SIMETHICONE 80 MG PO CHEW
80.0000 mg | CHEWABLE_TABLET | ORAL | Status: DC
  Administered 2019-07-13 – 2019-07-15 (×2): 80 mg via ORAL
  Filled 2019-07-13 (×3): qty 1

## 2019-07-13 MED ORDER — OXYTOCIN 40 UNITS IN NORMAL SALINE INFUSION - SIMPLE MED
2.5000 [IU]/h | INTRAVENOUS | Status: DC
  Administered 2019-07-13: 15:00:00 2.5 [IU]/h via INTRAVENOUS

## 2019-07-13 MED ORDER — SIMETHICONE 80 MG PO CHEW: 80.0000 mg | CHEWABLE_TABLET | ORAL | Status: DC | PRN

## 2019-07-13 MED ORDER — NALBUPHINE HCL 10 MG/ML IJ SOLN
5.0000 mg | Freq: Once | INTRAMUSCULAR | Status: DC | PRN
  Filled 2019-07-13: qty 0.5

## 2019-07-13 MED ORDER — OXYTOCIN 40 UNITS IN NORMAL SALINE INFUSION - SIMPLE MED
INTRAVENOUS | Status: AC
  Filled 2019-07-13: qty 1000

## 2019-07-13 MED ORDER — LACTATED RINGERS IV SOLN
INTRAVENOUS | Status: DC
  Administered 2019-07-13: 19:00:00 via INTRAVENOUS

## 2019-07-13 MED ORDER — DIPHENHYDRAMINE HCL 50 MG/ML IJ SOLN: 12.5000 mg | INTRAMUSCULAR | Status: DC | PRN

## 2019-07-13 MED ORDER — FENTANYL CITRATE (PF) 100 MCG/2ML IJ SOLN: 25.0000 ug | INTRAMUSCULAR | Status: DC | PRN

## 2019-07-13 MED ORDER — FLUTICASONE PROPIONATE 50 MCG/ACT NA SUSP
1.0000 | Freq: Every day | NASAL | Status: DC | PRN
  Administered 2019-07-13: 20:00:00 1 via NASAL
  Filled 2019-07-13: qty 16

## 2019-07-13 MED ORDER — MORPHINE SULFATE (PF) 0.5 MG/ML IJ SOLN
INTRAMUSCULAR | Status: DC | PRN
  Administered 2019-07-13: 2 mg via EPIDURAL

## 2019-07-13 MED ORDER — ENOXAPARIN SODIUM 60 MG/0.6ML ~~LOC~~ SOLN
50.0000 mg | SUBCUTANEOUS | Status: DC
  Administered 2019-07-14 – 2019-07-15 (×2): 50 mg via SUBCUTANEOUS
  Filled 2019-07-13 (×2): qty 0.6

## 2019-07-13 MED ORDER — LIDOCAINE-EPINEPHRINE (PF) 2 %-1:200000 IJ SOLN
INTRAMUSCULAR | Status: DC | PRN
  Administered 2019-07-13: 5 mL via INTRADERMAL
  Administered 2019-07-13: 5 mL via EPIDURAL
  Administered 2019-07-13: 4 mL via EPIDURAL
  Administered 2019-07-13 (×2): 3 mL via INTRADERMAL
  Administered 2019-07-13: 5 mL via EPIDURAL
  Administered 2019-07-13: 5 mL via INTRADERMAL

## 2019-07-13 MED ORDER — TETANUS-DIPHTH-ACELL PERTUSSIS 5-2.5-18.5 LF-MCG/0.5 IM SUSP: 0.5000 mL | Freq: Once | INTRAMUSCULAR | Status: DC

## 2019-07-13 MED ORDER — FENTANYL CITRATE (PF) 100 MCG/2ML IJ SOLN
INTRAMUSCULAR | Status: DC | PRN
  Administered 2019-07-13: 100 ug via EPIDURAL
  Administered 2019-07-13 (×2): 100 ug via INTRAVENOUS

## 2019-07-13 MED ORDER — ZOLPIDEM TARTRATE 5 MG PO TABS: 5.0000 mg | ORAL_TABLET | Freq: Every evening | ORAL | Status: DC | PRN

## 2019-07-13 MED ORDER — MISOPROSTOL 200 MCG PO TABS
ORAL_TABLET | ORAL | Status: AC
  Filled 2019-07-13: qty 5

## 2019-07-13 MED ORDER — NALOXONE HCL 4 MG/10ML IJ SOLN
1.0000 ug/kg/h | INTRAVENOUS | Status: DC | PRN
  Filled 2019-07-13: qty 5

## 2019-07-13 MED ORDER — PROMETHAZINE HCL 25 MG/ML IJ SOLN: 6.2500 mg | INTRAMUSCULAR | Status: DC | PRN

## 2019-07-13 MED ORDER — ONDANSETRON HCL 4 MG/2ML IJ SOLN
INTRAMUSCULAR | Status: DC | PRN
  Administered 2019-07-13: 4 mg via INTRAVENOUS

## 2019-07-13 MED ORDER — PRENATAL MULTIVITAMIN CH
1.0000 | ORAL_TABLET | Freq: Every day | ORAL | Status: DC
  Administered 2019-07-14 – 2019-07-15 (×2): 1 via ORAL
  Filled 2019-07-13 (×2): qty 1

## 2019-07-13 MED ORDER — TERBUTALINE SULFATE 1 MG/ML IJ SOLN: 0.2500 mg | Freq: Once | INTRAMUSCULAR | Status: DC | PRN

## 2019-07-13 MED ORDER — DEXAMETHASONE SODIUM PHOSPHATE 10 MG/ML IJ SOLN
INTRAMUSCULAR | Status: DC | PRN
  Administered 2019-07-13: 8 mg via INTRAVENOUS

## 2019-07-13 MED ORDER — ONDANSETRON HCL 4 MG/2ML IJ SOLN
INTRAMUSCULAR | Status: AC
  Filled 2019-07-13: qty 2

## 2019-07-13 MED ORDER — NIFEDIPINE ER OSMOTIC RELEASE 30 MG PO TB24
30.0000 mg | ORAL_TABLET | Freq: Every day | ORAL | Status: DC
  Administered 2019-07-13 – 2019-07-15 (×3): 30 mg via ORAL
  Filled 2019-07-13 (×3): qty 1

## 2019-07-13 MED ORDER — DEXAMETHASONE SODIUM PHOSPHATE 10 MG/ML IJ SOLN
INTRAMUSCULAR | Status: AC
  Filled 2019-07-13: qty 1

## 2019-07-13 MED ORDER — OXYTOCIN 40 UNITS IN NORMAL SALINE INFUSION - SIMPLE MED
1.0000 m[IU]/min | INTRAVENOUS | Status: DC
  Administered 2019-07-13: 01:00:00 4 m[IU]/min via INTRAVENOUS
  Administered 2019-07-13: 10:00:00 300 mL via INTRAVENOUS
  Administered 2019-07-13: 11:00:00 200 mL via INTRAVENOUS

## 2019-07-13 SURGICAL SUPPLY — 37 items
APL SKNCLS STERI-STRIP NONHPOA (GAUZE/BANDAGES/DRESSINGS) ×1
BENZOIN TINCTURE PRP APPL 2/3 (GAUZE/BANDAGES/DRESSINGS) ×2 IMPLANT
CHLORAPREP W/TINT 26ML (MISCELLANEOUS) ×3 IMPLANT
CLAMP CORD UMBIL (MISCELLANEOUS) IMPLANT
CLOSURE WOUND 1/2 X4 (GAUZE/BANDAGES/DRESSINGS) ×1
CLOTH BEACON ORANGE TIMEOUT ST (SAFETY) ×3 IMPLANT
DRSG OPSITE POSTOP 4X10 (GAUZE/BANDAGES/DRESSINGS) ×3 IMPLANT
ELECT REM PT RETURN 9FT ADLT (ELECTROSURGICAL) ×3
ELECTRODE REM PT RTRN 9FT ADLT (ELECTROSURGICAL) ×1 IMPLANT
EXTRACTOR VACUUM M CUP 4 TUBE (SUCTIONS) IMPLANT
EXTRACTOR VACUUM M CUP 4' TUBE (SUCTIONS)
GLOVE BIO SURGEON STRL SZ 6.5 (GLOVE) ×2 IMPLANT
GLOVE BIO SURGEONS STRL SZ 6.5 (GLOVE) ×1
GLOVE BIOGEL PI IND STRL 7.0 (GLOVE) ×2 IMPLANT
GLOVE BIOGEL PI INDICATOR 7.0 (GLOVE) ×4
GOWN STRL REUS W/TWL LRG LVL3 (GOWN DISPOSABLE) ×6 IMPLANT
KIT ABG SYR 3ML LUER SLIP (SYRINGE) IMPLANT
NDL HYPO 25X5/8 SAFETYGLIDE (NEEDLE) IMPLANT
NEEDLE HYPO 22GX1.5 SAFETY (NEEDLE) IMPLANT
NEEDLE HYPO 25X5/8 SAFETYGLIDE (NEEDLE) IMPLANT
NS IRRIG 1000ML POUR BTL (IV SOLUTION) ×3 IMPLANT
PACK C SECTION WH (CUSTOM PROCEDURE TRAY) ×3 IMPLANT
PAD OB MATERNITY 4.3X12.25 (PERSONAL CARE ITEMS) ×3 IMPLANT
PENCIL SMOKE EVAC W/HOLSTER (ELECTROSURGICAL) ×3 IMPLANT
STRIP CLOSURE SKIN 1/2X4 (GAUZE/BANDAGES/DRESSINGS) ×1 IMPLANT
SUT MON AB 4-0 PS1 27 (SUTURE) ×3 IMPLANT
SUT PLAIN 0 NONE (SUTURE) IMPLANT
SUT PLAIN 2 0 XLH (SUTURE) IMPLANT
SUT VIC AB 0 CT1 36 (SUTURE) ×6 IMPLANT
SUT VIC AB 0 CTX 36 (SUTURE) ×6
SUT VIC AB 0 CTX36XBRD ANBCTRL (SUTURE) ×2 IMPLANT
SUT VIC AB 2-0 CT1 27 (SUTURE) ×3
SUT VIC AB 2-0 CT1 TAPERPNT 27 (SUTURE) ×1 IMPLANT
SYR CONTROL 10ML LL (SYRINGE) IMPLANT
TOWEL OR 17X24 6PK STRL BLUE (TOWEL DISPOSABLE) ×3 IMPLANT
TRAY FOLEY W/BAG SLVR 14FR LF (SET/KITS/TRAYS/PACK) IMPLANT
WATER STERILE IRR 1000ML POUR (IV SOLUTION) ×3 IMPLANT

## 2019-07-13 NOTE — Progress Notes (Signed)
Mother of baby was referred for history of anxiety. Referral screened out by CSW because per chart review, MOB is actively taking Buspar daily to address her symptoms.   Please contact CSW if mother of baby requests, if needs arise, or if mother of baby scores greater than a nine or answers yes to question ten on Edinburgh Postpartum Depression Screen.   Kurtis Anastasia, MSW, LCSW-A Transitions of Care  Clinical Social Worker  [facility] [phone]    

## 2019-07-13 NOTE — Transfer of Care (Signed)
Immediate Anesthesia Transfer of Care Note  Patient: Devin Foskey  Procedure(s) Performed: CESAREAN SECTION (N/A Abdomen)  Patient Location: PACU  Anesthesia Type:Epidural  Level of Consciousness: awake, alert  and patient cooperative  Airway & Oxygen Therapy: Patient Spontanous Breathing  Post-op Assessment: Report given to RN and Post -op Vital signs reviewed and stable  Post vital signs: Reviewed and stable  Last Vitals:  Vitals Value Taken Time  BP    Temp    Pulse    Resp 19 07/13/19 1051  SpO2    Vitals shown include unvalidated device data.  Last Pain:  Vitals:   07/13/19 0800  TempSrc: Oral  PainSc: 0-No pain       SEE PACU VITAL SIGN FLOW SHEET  Complications: No apparent anesthesia complications

## 2019-07-13 NOTE — Op Note (Signed)
07/13/2019  10:49 AM  PATIENT:  Emily Delgado  23 y.o. female  PRE-OPERATIVE DIAGNOSIS:  arrest of dilation, worsening chronic hypertension  POST-OPERATIVE DIAGNOSIS:  arrest of dilation, worsening chronic hypertension  PROCEDURE:  Procedure(s): CESAREAN SECTION (N/A)  Low-transverse cesarean section with 2 layer closure, primary cesarean section  SURGEON:  Surgeon(s) and Role:    * Noland Fordyce, MD - Primary  PHYSICIAN ASSISTANT: Surgical tech  ASSISTANTS: none   ANESTHESIA:   local and epidural  EBL: Per nursing and anesthesia notes  BLOOD ADMINISTERED:none  DRAINS: Urinary Catheter (Foley)   LOCAL MEDICATIONS USED:  MARCAINE    and Amount: 30 cc quarter percent injected into the incision at the end of the case due to patient's complaint of burning ml  SPECIMEN:  Source of Specimen:  Placenta  DISPOSITION OF SPECIMEN:  To labor and delivery for disposal  COUNTS:  YES  TOURNIQUET:  * No tourniquets in log *  DICTATION: .Note written in EPIC  PLAN OF CARE: Admit to inpatient   PATIENT DISPOSITION:  PACU - hemodynamically stable.   Delay start of Pharmacological VTE agent (>24hrs) due to surgical blood loss or risk of bleeding: yes     Findings:  @BABYSEXEBC @ infant,  APGAR (1 MIN): 9   APGAR (5 MINS): 9   APGAR (10 MINS):   Normal uterus, tubes and ovaries, normal placenta. 3VC, clear amniotic fluid  EBL: Per nursing notes cc Antibiotics:   2g Ancef Complications: none  Indications: This is a 23 y.o. year-old, G1 at [redacted]w[redacted]d admitted for induction of labor due to worsening chronic hypertension without evidence of preeclampsia.  Patient was admitted given Cytotec for ripening.  She was then started on Pitocin and had a cervical Foley bulb.  After Foley came out and about 24 hours into her induction of labor artificial rupture of membranes were done.  Pitocin was at 30 milliunits/min for many hours and patient was unable to achieve adequacy.  She then  had a 4-hour Pitocin break.  Pitocin was restarted and titrated back up.  She had episodes of adequacy by [place] units but never progressed past 5 cm.  Decision was made to proceed with a primary cesarean section.. Risks benefits and alternatives of the procedure were discussed with the patient who agreed to proceed  Procedure:  After informed consent was obtained the patient was taken to the operating room where epidural anesthesia was found to be adequate.  She was prepped and draped in the normal sterile fashion in dorsal supine position with a leftward tilt.  A foley catheter was in place.  A Pfannenstiel skin incision was made 2 cm above the pubic symphysis in the midline with the scalpel.  Dissection was carried down with the Bovie cautery until the fascia was reached. The fascia was incised in the midline. The incision was extended laterally with the Mayo scissors. The inferior aspect of the fascial incision was grasped with the Coker clamps, elevated up and the underlying rectus muscles were dissected off sharply. The superior aspect of the fascial incision was grasped with the Coker clamps elevated up and the underlying rectus muscles were dissected off sharply.  The peritoneum was entered bluntly. The peritoneal incision was extended superiorly and inferiorly with good visualization of the bladder. The bladder blade was inserted and palpation was done to assess the fetal position and the location of the uterine vessels. The lower segment of the uterus was incised sharply with the scalpel and extended  bluntly in  the cephalo-caudal fashion. The infant was grasped, brought to the incision,  rotated and the infant was delivered with fundal pressure. The nose and mouth were bulb suctioned. The cord was clamped and cut after 1 minute delay. The infant was handed off to the waiting pediatrician. The placenta was expressed. The uterus was exteriorized. The uterus was cleared of all clots and debris. The  uterine incision was repaired with 0 Vicryl in a running locked fashion.  A second layer of the same suture was used in an imbricating fashion to obtain excellent hemostasis.  The uterus was then returned to the abdomen, the gutters were cleared of all clots and debris. The uterine incision was reinspected and found to be hemostatic. The peritoneum was grasped and closed with 2-0 Vicryl in a running fashion. The cut muscle edges and the underside of the fascia were inspected and found to be hemostatic. The fascia was closed with 0 Vicryl in a single layer . The subcutaneous tissue was irrigated. Scarpa's layer was closed with a 2-0 plain gut suture. The skin was closed with a 4-0 Monocryl in a single layer.  The patient then noted some burning on the right side.  This was the same area where the anesthesiologist have concern for spotty coverage.  30 cc of quarter percent Marcaine was injected into the skin incision patient noted burning improved.  The patient tolerated the procedure well. Sponge lap and needle counts were correct x3 and patient was taken to the recovery room in a stable condition.  Ala Dach 07/13/2019 10:50 AM

## 2019-07-13 NOTE — Brief Op Note (Signed)
07/13/2019  10:49 AM  PATIENT:  Emily Delgado  23 y.o. female  PRE-OPERATIVE DIAGNOSIS:  arrest of dilation, worsening chronic hypertension  POST-OPERATIVE DIAGNOSIS:  arrest of dilation, worsening chronic hypertension  PROCEDURE:  Procedure(s): CESAREAN SECTION (N/A)  Low-transverse cesarean section with 2 layer closure, primary cesarean section  SURGEON:  Surgeon(s) and Role:    * Aloha Gell, MD - Primary  PHYSICIAN ASSISTANT: Surgical tech  ASSISTANTS: none   ANESTHESIA:   local and epidural  EBL: Per nursing and anesthesia notes  BLOOD ADMINISTERED:none  DRAINS: Urinary Catheter (Foley)   LOCAL MEDICATIONS USED:  MARCAINE    and Amount: 30 cc quarter percent injected into the incision at the end of the case due to patient's complaint of burning ml  SPECIMEN:  Source of Specimen:  Placenta  DISPOSITION OF SPECIMEN:  To labor and delivery for disposal  COUNTS:  YES  TOURNIQUET:  * No tourniquets in log *  DICTATION: .Note written in EPIC  PLAN OF CARE: Admit to inpatient   PATIENT DISPOSITION:  PACU - hemodynamically stable.   Delay start of Pharmacological VTE agent (>24hrs) due to surgical blood loss or risk of bleeding: yes

## 2019-07-13 NOTE — Lactation Note (Signed)
This note was copied from a baby's chart. Lactation Consultation Note  Patient Name: Emily Delgado UTMLY'Y Date: 07/13/2019 Reason for consult: Initial assessment;Primapara;1st time breastfeeding;Early term 37-38.6wks;Infant < 6lbs;Breast reduction  1628 - 1656 - I conducted an initial consult with [name], a P1 with a 57 hour old daughter, Emily Delgado. She reports that she had breast surgery 2 years ago with 60% reduction. She seemed to be informed that this could affect her milk production. We discussed possible consequences of surgery to milk production, and mom was accepting of these possibilities.  She had already givine Emily Delgado 10 ounces by bottle about 90 minutes prior to visit. Dad was holding baby; I put an extra blanket on baby and fetched them a hat (baby was without one).  We discussed baby's GA and size, and I recommended the LPTI protocol. They verbalized understanding. We set up a DEBP and help mom learn how to hand express and how to intiate pumping (size 24 flanges).  Lots of basic breast feeding education conducted, and I shared the resources brochure.   PLAN: Hold baby STS and offer breast upon feeding cue. Make sure baby eats at least 8-12 times a day. If baby breast feeds, supplement and pump following. Limit time on breast to 30 minutes. Supplement 10 mls today (watch baby for hunger cues, and increase amount as needed). Pump 8 times a day for 15 minutes, both breasts. Call lactation when ready for support with latching baby.  Maternal Data Formula Feeding for Exclusion: Yes Reason for exclusion: Mother's choice to formula and breast feed on admission;Previous breast surgery (mastectomy, reduction, or augmentation where mother is unable to produce breast milk) Has patient been taught Hand Expression?: Yes Does the patient have breastfeeding experience prior to this delivery?: No  Feeding Feeding Type: Bottle Fed - Formula Nipple Type: Slow - flow  LATCH  Score                   Interventions Interventions: Breast feeding basics reviewed;Hand express;DEBP  Lactation Tools Discussed/Used Tools: Pump Breast pump type: Double-Electric Breast Pump Pump Review: Setup, frequency, and cleaning Initiated by:: HL Date initiated:: 07/13/19   Consult Status Consult Status: Follow-up Date: 07/14/19 Follow-up type: In-patient    Emily Delgado 07/13/2019, 5:26 PM

## 2019-07-13 NOTE — Anesthesia Postprocedure Evaluation (Signed)
Anesthesia Post Note  Patient: Suheily Birks  Procedure(s) Performed: CESAREAN SECTION (N/A Abdomen)     Patient location during evaluation: PACU Anesthesia Type: Epidural Level of consciousness: awake and alert and oriented Pain management: pain level controlled Vital Signs Assessment: post-procedure vital signs reviewed and stable Respiratory status: spontaneous breathing, nonlabored ventilation and respiratory function stable Cardiovascular status: blood pressure returned to baseline Postop Assessment: no apparent nausea or vomiting and epidural receding Anesthetic complications: no    Last Vitals:  Vitals:   07/13/19 1130 07/13/19 1145  BP: 134/86   Pulse: 92 91  Resp: 20 20  Temp:    SpO2: 97% 95%    Last Pain:  Vitals:   07/13/19 1054  TempSrc:   PainSc: 0-No pain   Pain Goal:                Epidural/Spinal Function Cutaneous sensation: Able to Wiggle Toes (07/13/19 1054), Patient able to flex knees: Yes (07/13/19 1054), Patient able to lift hips off bed: Yes (07/13/19 1054), Back pain beyond tenderness at insertion site: No (07/13/19 1054), Progressively worsening motor and/or sensory loss: No (07/13/19 1054), Bowel and/or bladder incontinence post epidural: No (07/13/19 1054)  Brennan Bailey

## 2019-07-13 NOTE — Progress Notes (Signed)
S: Doing well, no complaints, pain well controlled with epidural though needed re-dosing around 4am, now comfortable. No HA  O: BP 129/74   Pulse (!) 118   Temp 98.5 F (36.9 C) (Oral)   Resp 16   Ht 5\' 3"  (1.6 m)   Wt 100.2 kg   SpO2 97%   BMI 39.15 kg/m    FHT:  FHR: 135s bpm, variability: moderate,  accelerations:  Present,  decelerations:  Absent UC:   regular, every 2-3 minutes, pitocin retitrated back to 32 munits/ min afte4 4 hr washout from 8:30p-12:30a SVE:   Dilation: 6 Effacement (%): 70, 80 Station: -1 Exam by:: Dr. Pamala Hurry   A / P:  23 y.o.  OB History  Gravida Para Term Preterm AB Living  1 0 0 0 0 0  SAB TAB Ectopic Multiple Live Births  0 0 0 0 0   at [redacted]w[redacted]d IOL for worsening getational hypertension, no evidence PEC. Pt now with arrest of dilation. Pt has had intermittent periods of adequacy but with no cervical change from 5cm over >12 hrs. Effaced cervix c/w labor but now no change and with caput and molding  Fetal Wellbeing:  Category I Pain Control:  Epidural  Anticipated MOD:  PCS  Floyce Stakes Drury Ardizzone 07/13/2019, 7:55 AM

## 2019-07-13 NOTE — Progress Notes (Signed)
Subjective:   no pain but pressure - using PCA button with any pressure  Objective:   sitting straight up in bed VS: BP (!) 141/93   Pulse 85   Temp 98.4 F (36.9 C) (Oral)   Resp 18   Ht 5\' 3"  (1.6 m)   Wt 100.2 kg   SpO2 97%   BMI 39.15 kg/m  FHR : baseline 130 / variability moderate / accelerations + / no decelerations Toco: contractions every 2-3 minutes / pitocin 24 mu/min / MVU 130-140  cervix : no change 6-7 / 80 / -1 with small caput / ROT  membranes: clear fluid - no show  Foley - yellow urine - large amount  Assessment /Plan:  Protracted labor with marginal MVU FHR category 1  Repositioned far lateral with HOB lowered /placed peanut Suspect excess PCA use - instructed not to use PCA as too much can affect ctx pattern and To notify nurse with increased pain - reassured basal amount & will not run out  Artelia Laroche CNM, MSN, [facility] 07/13/2019, 4:04 AM

## 2019-07-14 DIAGNOSIS — I10 Essential (primary) hypertension: Secondary | ICD-10-CM | POA: Diagnosis present

## 2019-07-14 DIAGNOSIS — D62 Acute posthemorrhagic anemia: Secondary | ICD-10-CM | POA: Diagnosis not present

## 2019-07-14 LAB — CBC
HCT: 26 % — ABNORMAL LOW (ref 36.0–46.0)
Hemoglobin: 8.7 g/dL — ABNORMAL LOW (ref 12.0–15.0)
MCH: 30.5 pg (ref 26.0–34.0)
MCHC: 33.5 g/dL (ref 30.0–36.0)
MCV: 91.2 fL (ref 80.0–100.0)
Platelets: 212 10*3/uL (ref 150–400)
RBC: 2.85 MIL/uL — ABNORMAL LOW (ref 3.87–5.11)
RDW: 13.4 % (ref 11.5–15.5)
WBC: 12 10*3/uL — ABNORMAL HIGH (ref 4.0–10.5)
nRBC: 0 % (ref 0.0–0.2)

## 2019-07-14 MED ORDER — TRAMADOL HCL 50 MG PO TABS
50.0000 mg | ORAL_TABLET | Freq: Four times a day (QID) | ORAL | Status: DC | PRN
  Administered 2019-07-14 – 2019-07-15 (×4): 50 mg via ORAL
  Filled 2019-07-14 (×4): qty 1

## 2019-07-14 MED ORDER — MAGNESIUM OXIDE 400 (241.3 MG) MG PO TABS
400.0000 mg | ORAL_TABLET | Freq: Every day | ORAL | Status: DC
  Administered 2019-07-14 – 2019-07-15 (×2): 400 mg via ORAL
  Filled 2019-07-14 (×2): qty 1

## 2019-07-14 MED ORDER — POLYSACCHARIDE IRON COMPLEX 150 MG PO CAPS
150.0000 mg | ORAL_CAPSULE | Freq: Every day | ORAL | Status: DC
  Administered 2019-07-14 – 2019-07-15 (×2): 150 mg via ORAL
  Filled 2019-07-14 (×2): qty 1

## 2019-07-14 NOTE — Lactation Note (Signed)
This note was copied from a baby's chart. Lactation Consultation Note  Patient Name: Emily Delgado MPNTI'R Date: 07/14/2019 Reason for consult: Follow-up assessment  LC Follow Up Visit:  Attempted to visit with mother, however, she was having a conversation and asked if I could return later.  I suggested she call me when it would be a good time to visit.    Consult Status Consult Status: Follow-up Date: 07/14/19 Follow-up type: In-patient    [name] 07/14/2019, 5:36 PM

## 2019-07-14 NOTE — Progress Notes (Signed)
POSTOPERATIVE DAY # 1 S/P CS  - arrest of dilation  Subjective:  reports feeling ok - tired & lots of cramps - feeling gas move tolerating po intake / no nausea / no vomiting / no flatus / no BM pain controlled  up ad lib / ambulatory in room / voiding QS  Newborn Breast   Objective: VS: BP 123/72 (BP Location: Left Arm)   Pulse 99   Temp 97.6 F (36.4 C) (Oral)   Resp 16   Ht 5\' 3"  (1.6 m)   Wt 100.2 kg   SpO2 99%   Breastfeeding Unknown   BMI 39.15 kg/m    BP: 123/72 - 132/72 - 142/85 - 141/97       LABS:  Recent Labs    07/13/19 0804 07/14/19 0527  WBC 17.3* 12.0*  HGB 12.6 8.7*  PLT 237 212    Bloodtype: O negative / newborn RH negative - no rhogam indicated  Rubella: Immune (04/08 0000)    Flu vaccine declined / tdap current 2020                                         I&O: Intake/Output      10/25 0701 - 10/26 0700 10/26 0701 - 10/27 0700   I.V. (mL/kg) 4260.1 (42.5)    Other 435.4    Total Intake(mL/kg) 4695.5 (46.9)    Urine (mL/kg/hr) 2775 (1.2)    Blood 697    Total Output 3472    Net +1223.5         Physical Exam: alert and oriented X3 without any distress or pain lung fields are clear and unlabored heart rate regular / normal rhythm / no mumurs abdomen soft, non-tender, slightly distended  / bowel sounds are active uterine fundus firm, non-tender, Ueven surgical incision assessed with honeycomb dressing intact with bloody drainage over 1/3 right side lochia: scant extremities: 2+ pedal edema, no calf pain or tenderness  Assessment / Plan:   POD # 1 S/P CS for arrest of dilation Chronic hypertension - Procardia 30 XL ABL anemia  routine postoperative care  Start iron and magnesium oxide change dressing Add Tramadol for pain - on Lovenox post-op prophylaxis  Artelia Laroche CNM, MSN, [facility] 07/14/2019, 8:09 AM

## 2019-07-15 LAB — COMPREHENSIVE METABOLIC PANEL
ALT: 17 U/L (ref 0–44)
AST: 15 U/L (ref 15–41)
Albumin: 1.9 g/dL — ABNORMAL LOW (ref 3.5–5.0)
Alkaline Phosphatase: 74 U/L (ref 38–126)
Anion gap: 6 (ref 5–15)
BUN: 8 mg/dL (ref 6–20)
CO2: 26 mmol/L (ref 22–32)
Calcium: 8.1 mg/dL — ABNORMAL LOW (ref 8.9–10.3)
Chloride: 104 mmol/L (ref 98–111)
Creatinine, Ser: 0.54 mg/dL (ref 0.44–1.00)
GFR calc Af Amer: >60 mL/min (ref >60)
GFR calc non Af Amer: >60 mL/min (ref >60)
Glucose, Bld: 97 mg/dL (ref 70–99)
Potassium: 3.4 mmol/L — ABNORMAL LOW (ref 3.5–5.1)
Sodium: 136 mmol/L (ref 135–145)
Total Bilirubin: <0.1 mg/dL — ABNORMAL LOW (ref 0.3–1.2)
Total Protein: 4.7 g/dL — ABNORMAL LOW (ref 6.5–8.1)

## 2019-07-15 LAB — CBC WITH DIFFERENTIAL/PLATELET
Abs Immature Granulocytes: 0.06 10*3/uL (ref 0.00–0.07)
Basophils Absolute: 0 10*3/uL (ref 0.0–0.1)
Basophils Relative: 0 %
Eosinophils Absolute: 0.2 10*3/uL (ref 0.0–0.5)
Eosinophils Relative: 1 %
HCT: 25.9 % — ABNORMAL LOW (ref 36.0–46.0)
Hemoglobin: 8.4 g/dL — ABNORMAL LOW (ref 12.0–15.0)
Immature Granulocytes: 1 %
Lymphocytes Relative: 19 %
Lymphs Abs: 2 10*3/uL (ref 0.7–4.0)
MCH: 30.4 pg (ref 26.0–34.0)
MCHC: 32.4 g/dL (ref 30.0–36.0)
MCV: 93.8 fL (ref 80.0–100.0)
Monocytes Absolute: 0.7 10*3/uL (ref 0.1–1.0)
Monocytes Relative: 6 %
Neutro Abs: 7.8 10*3/uL — ABNORMAL HIGH (ref 1.7–7.7)
Neutrophils Relative %: 73 %
Platelets: 223 10*3/uL (ref 150–400)
RBC: 2.76 MIL/uL — ABNORMAL LOW (ref 3.87–5.11)
RDW: 14 % (ref 11.5–15.5)
WBC: 10.7 10*3/uL — ABNORMAL HIGH (ref 4.0–10.5)
nRBC: 0 % (ref 0.0–0.2)

## 2019-07-15 MED ORDER — IBUPROFEN 600 MG PO TABS
600.0000 mg | ORAL_TABLET | Freq: Four times a day (QID) | ORAL | Status: DC
  Administered 2019-07-15: 12:00:00 600 mg via ORAL
  Filled 2019-07-15: qty 1

## 2019-07-15 MED ORDER — IBUPROFEN 600 MG PO TABS: 600.0000 mg | ORAL_TABLET | Freq: Four times a day (QID) | ORAL | 0 refills | Status: DC

## 2019-07-15 MED ORDER — OXYCODONE HCL 5 MG PO TABS: 5.0000 mg | ORAL_TABLET | ORAL | Status: DC | PRN

## 2019-07-15 MED ORDER — HYDROCHLOROTHIAZIDE 25 MG PO TABS: 25.0000 mg | ORAL_TABLET | Freq: Every day | ORAL | Status: DC

## 2019-07-15 MED ORDER — ACETAMINOPHEN 325 MG PO TABS
650.0000 mg | ORAL_TABLET | ORAL | Status: DC | PRN
  Administered 2019-07-15 (×4): 650 mg via ORAL
  Filled 2019-07-15 (×4): qty 2

## 2019-07-15 MED ORDER — SODIUM CHLORIDE 0.9 % IV SOLN
510.0000 mg | Freq: Once | INTRAVENOUS | Status: AC
  Administered 2019-07-15: 510 mg via INTRAVENOUS
  Filled 2019-07-15: qty 17

## 2019-07-15 MED ORDER — SENNOSIDES-DOCUSATE SODIUM 8.6-50 MG PO TABS: 2.0000 | ORAL_TABLET | Freq: Every evening | ORAL | 0 refills | Status: DC | PRN

## 2019-07-15 MED ORDER — OXYCODONE HCL 5 MG PO TABS: 5.0000 mg | ORAL_TABLET | ORAL | 0 refills | Status: DC | PRN

## 2019-07-15 MED ORDER — HYDROCHLOROTHIAZIDE 25 MG PO TABS: 25.0000 mg | ORAL_TABLET | Freq: Every day | ORAL | 0 refills | Status: DC

## 2019-07-15 MED ORDER — NIFEDIPINE ER 30 MG PO TB24: 30.0000 mg | ORAL_TABLET | Freq: Every day | ORAL | 0 refills | Status: DC

## 2019-07-15 NOTE — Discharge Summary (Signed)
Obstetric Discharge Summary   Patient Name: Emily Delgado DOB: [birth year] MRN: [id number]  Date of Admission: 07/11/2019 Date of Discharge: 07/15/2019 Date of Delivery: 07/13/2019 Gestational Age at Delivery: [redacted]w[redacted]d  Primary OB: [facility] - Dr. Pamala Hurry   Antepartum complications:  - asthma, on inhalers -chronic htn, bps elevated in 1st trimester and diagnosis made. Nl baseline PIH lab, started on baby ASA and 30mg  Procardia. Acute elevations in bp at 33wks, BMZ completed, labs, sx and bps remained elevated but stable since then on increased doses of Procardia 20m XL qday.  - Anxiety, on Buspar - AGA - GBS neg Prenatal Labs:  ABO, Rh: O/Negative/-- (04/08 0000) Antibody: Negative (04/08 0000) Rubella: Immune (04/08 0000) RPR: Nonreactive (04/08 0000)  HBsAg: Negative (04/08 0000)  HIV: Non-reactive (04/08 0000)  GBS:   neg 1 hr Glucola 122          Genetic screening nl quad Anatomy US normal Admitting Diagnosis: IOL at 37 weeks for Chronic HTN with gestational exacerbation   Secondary Diagnoses: Patient Active Problem List   Diagnosis Date Noted  . Acute blood loss anemia 07/14/2019  . Chronic hypertension 07/14/2019  . Cesarean delivery delivered 07/13/2019  . Postpartum care following cesarean delivery (10/25) 07/13/2019  . Encounter for induction of labor 07/11/2019   Induction: cytotec, foley bulb Augmentation: AROM and Pitocin   Date of Delivery: 07/13/2019 Delivered By: Dr. Pamala Hurry  Delivery Type: primary cesarean section, low transverse incision  Newborn Data: Live born female  Birth Weight: 5 lb 15.8 oz (2715 g) APGAR: 9, 9  Newborn Delivery   Birth date/time: 07/13/2019 09:54:00 Delivery type: C-Section, Low Transverse Trial of labor: Yes C-section categorization: Primary      Hospital/Postpartum Course  (Cesarean Section):  Pt. Admitted for IOL for CHTN with acute gestational exacerbation.  She had arrest of dilation at 6cm and  decision for PCS discussed. See notes and delivery summary for details. Patient had an uncomplicated postpartum course.  By time of discharge on POD#2, her pain was controlled on oral pain medications; she had appropriate lochia and was ambulating, voiding without difficulty, tolerating regular diet and passing flatus.  She had labile mild range BPs, asymptomatic and HCTZ 25mg  PO daily was added for 5 days. She was deemed stable for discharge to home.     Labs: CBC Latest Ref Rng & Units 07/15/2019 07/14/2019 07/13/2019  WBC 4.0 - 10.5 K/uL 10.7(H) 12.0(H) 17.3(H)  Hemoglobin 12.0 - 15.0 g/dL 8.4(L) 8.7(L) 12.6  Hematocrit 36.0 - 46.0 % 25.9(L) 26.0(L) 38.0  Platelets 150 - 400 K/uL 536 144 315   Conflict (See Lab Report): O NEG/O NEG Performed at [facility] [address], [place], [place] [address]   Physical exam:  BP (!) 141/88 (BP Location: Left Arm)   Pulse (!) 112   Temp 97.9 F (36.6 C) (Oral)   Resp 20   Ht 5\' 3"  (1.6 m)   Wt 100.2 kg   SpO2 97%   Breastfeeding Unknown   BMI 39.15 kg/m   Alert and Oriented X3             Lungs: Clear and unlabored             Heart: regular rate and rhythm / no murmurs             Abdomen: soft, non-tender, mild gaseous distention, active bowel sounds             Fundus: firm, non-tender, U-1  Dressing: honeycomb dressing with steri-strips c/d/i              Incision:  approximated with sutures / no erythema / no ecchymosis / no drainage             Perineum: intact             Lochia: scant rubra on pad             Extremities: +2 LE edema, no calf pain or tenderness,   Disposition: stable, discharge to home Baby Feeding: formula Baby Disposition: home with mom  Rh Immune globulin given: N/A Rubella vaccine given: N/A Tdap vaccine given in AP or PP setting: UTD Flu vaccine given in AP or PP setting: declined   Plan:  Emily Delgado was discharged to home in good condition. Follow-up appointment  at [facility] in 1 week for BP check  Discharge Instructions: Per After Visit Summary. Refer to After Visit Summary and [facility] discharge booklet  Activity: Advance as tolerated. Pelvic rest for 6 weeks.   Diet: Regular, Heart Healthy Discharge Medications: Allergies as of 07/15/2019   No Known Allergies     Medication List    STOP taking these medications   aspirin EC 81 MG tablet   azithromycin 250 MG tablet Commonly known as: ZITHROMAX   dicyclomine 20 MG tablet Commonly known as: BENTYL   ondansetron 4 MG tablet Commonly known as: ZOFRAN   pantoprazole 20 MG tablet Commonly known as: PROTONIX   predniSONE 20 MG tablet Commonly known as: DELTASONE     TAKE these medications   albuterol 108 (90 Base) MCG/ACT inhaler Commonly known as: VENTOLIN HFA Inhale 1 puff into the lungs every 6 (six) hours as needed for wheezing or shortness of breath.   busPIRone 7.5 MG tablet Commonly known as: BUSPAR Take 7.5 mg by mouth 2 (two) times daily.   butalbital-acetaminophen-caffeine 50-325-40 MG tablet Commonly known as: FIORICET Take 1-2 tablets by mouth every 4 (four) hours as needed for headache.   hydrochlorothiazide 25 MG tablet Commonly known as: HYDRODIURIL Take 1 tablet (25 mg total) by mouth daily.   ibuprofen 600 MG tablet Commonly known as: ADVIL Take 1 tablet (600 mg total) by mouth every 6 (six) hours.   NIFEdipine 30 MG 24 hr tablet Commonly known as: ADALAT CC Take 1 tablet (30 mg total) by mouth daily. What changed: when to take this   oxyCODONE 5 MG immediate release tablet Commonly known as: Oxy IR/ROXICODONE Take 1-2 tablets (5-10 mg total) by mouth every 4 (four) hours as needed for severe pain.   prenatal multivitamin Tabs tablet Take 1 tablet by mouth daily at 12 noon.   Pulmicort Flexhaler 90 MCG/ACT inhaler Generic drug: Budesonide Inhale 1-2 puffs into the lungs daily.   senna-docusate 8.6-50 MG tablet Commonly known as:  Senokot-S Take 2 tablets by mouth at bedtime as needed for mild constipation.      Outpatient follow up:  Follow-up Information    Noland Fordyce, MD. Schedule an appointment as soon as possible for a visit in 1 week(s).   Specialty: Obstetrics and Gynecology Why: Blood pressure check; EPDS Contact information: [address] [place] [address] [phone]           Signed:  Carlean Jews, MSN, CNM [facility]

## 2019-07-15 NOTE — Progress Notes (Signed)
CSW aware of consult for score of 11 on [place] Depression Screen. CSW received phone call from RN who reported she spoke with MOB and MOB stated she was in a lot of pain yesterday when taking [place]. Per RN, she had MOB retake Edinburgh Depression Screen and MOB scored a 5. RN voiced no concerns for MOB at this time. Please reconsult CSW if further needs arise or if by MOB request.  Krystyl Cannell, LCSWA  [facility] [phone]  

## 2019-07-15 NOTE — Progress Notes (Signed)
Called to patient's room. Patient stated she felt like the ultram she was getting was not helping and she also asked if she could get an abdominal binder to help with some of her pain as well. Informed patient I was unsure if there was anything stronger she could get but I would call her doctor and let them know. Called Dr. Murrell Redden and explained situation. Given verbal order for abdominal binder and 650 mg of tylenol in addition to the ultram.

## 2019-07-15 NOTE — Progress Notes (Addendum)
Interval note:   Pt. S/p IV Feraheme.  Adamant about going home - states she feels much better. Passed 2 small blood clots, but reports stomach is also feeling better. Added HCTZ 25mg  daily x 5 days. PEC warning s/s reviewed. Pt. To monitor BPs daily and call for elevated BPs. Plan to f/u in 1 week for BP check. Ambulation encouraged. Discharge home. [facility] discharge book given and instructions and warning s/s reviewed. Watch for signs of PPD/anxiety given EPDS score. All questions answered.  Lars Pinks, CNM

## 2019-07-15 NOTE — Progress Notes (Signed)
Pt states she is feeling much better today and that she does not need to see the social worker again.   Edin=11 and consult was re entered for this.  She was in a lot of pain yesterday but that has resolved for the most part today.  She repeated the [place] again today and scored 5.    Ramond Marrow (SW on for today) was notfied at [phone].    Pt will call out if she decides that she does need to see SW.  She would like to get d/c home today if at all possible.

## 2019-07-15 NOTE — Progress Notes (Signed)
Called to patients room by patients husband. When RN entered room, pts husband informed RN that patient had passed a clot in the toilet. Upon inspection, RN did notice a small clot in the bottom of toilet as well as a couple small clots on toilet paper as patient wiped. Patient ambulated back to bed by self and RN performed fundal assessment. Fundus firm, midline and 1 below with no trickling or clots expressed during fundal massage. RN educated on what to look for with bleeding and instructed patient to call out if she passed anymore clots or felt a large gush of blood. Patient voiced understanding. Will continue to monitor.

## 2019-07-15 NOTE — Lactation Note (Signed)
This note was copied from a baby's chart. Lactation Consultation Note  Patient Name: Emily Delgado TXMIW'O Date: 07/15/2019 Reason for consult: Follow-up assessment  LC Follow Up Visit:  Mother has been exclusively bottle feeding using formula since the last lactation visit.  Questioned mother about her current feeding preference and she desires to formula feed only.  Acknowledged her wishes and answered a few questions regarding bottle feeding.    Engorgement prevention treatment reviewed with mother.  Father present.    Consult Status Consult Status: Complete Date: 07/15/19 Follow-up type: Call as needed    Emily Delgado 07/15/2019, 8:24 AM

## 2019-07-15 NOTE — Progress Notes (Addendum)
POSTOPERATIVE DAY # 2 S/P [facility] for arrest of dilation, baby girl "Gwyn"   S:         Reports feeling good, requesting early d/c home because FOB has to work tomorrow  Denies HA, visual changes, RUQ/epigastric pain              Tolerating po intake / no nausea / no vomiting / + flatus / no BM  Occasional dizziness if she moves too quickly   Denies SOB, or CP             Bleeding is getting lighter             Pain controlled not well controlled with Tramadol and Tylenol             Up ad lib / ambulatory/ voiding QS  Newborn formula feeding   O:  VS: BP 139/87 (BP Location: Left Arm)   Pulse (!) 114   Temp (!) 97.5 F (36.4 C) (Oral)   Resp 18   Ht 5\' 3"  (1.6 m)   Wt 100.2 kg   SpO2 100%   Breastfeeding Unknown   BMI 39.15 kg/m  07/15/19 1512  97.9 F (36.6 C)  112Abnormal   -  20  141/88Abnormal   Semi-fowlers  97 %  -  - EM   07/15/19 1130  -  114Abnormal   -  -  -  -  -  -  - EM   07/15/19 1100  -  125Abnormal   -  -  139/87  Sitting  100 %  -  - EM   07/15/19 0542  97.5 F (36.4 C)Abnormal   103Abnormal   -  18  141/87Abnormal   Sitting  97 %  -  - DG   07/14/19 2237  97.7 F (36.5 C)  136Abnormal   -  18  141/70Abnormal   Sitting  -  -  - TH   07/14/19 0614  97.6 F (36.4 C)  99  -  16  123/72  Semi-fowlers  99 %  -  - DG   07/14/19 0330  98.1 F (36.7 C)                       LABS:  Results for orders placed or performed during the hospital encounter of 07/11/19 (from the past 24 hour(s))  CBC with Differential/Platelet     Status: Abnormal   Collection Time: 07/15/19 11:41 AM  Result Value Ref Range   WBC 10.7 (H) 4.0 - 10.5 K/uL   RBC 2.76 (L) 3.87 - 5.11 MIL/uL   Hemoglobin 8.4 (L) 12.0 - 15.0 g/dL   HCT 40.125.9 (L) 02.736.0 - 25.346.0 %   MCV 93.8 80.0 - 100.0 fL   MCH 30.4 26.0 - 34.0 pg   MCHC 32.4 30.0 - 36.0 g/dL   RDW 66.414.0 40.311.5 - 47.415.5 %   Platelets 223 150 - 400 K/uL   nRBC 0.0 0.0 - 0.2 %   Neutrophils Relative % 73 %   Neutro Abs 7.8 (H) 1.7 - 7.7  K/uL   Lymphocytes Relative 19 %   Lymphs Abs 2.0 0.7 - 4.0 K/uL   Monocytes Relative 6 %   Monocytes Absolute 0.7 0.1 - 1.0 K/uL   Eosinophils Relative 1 %   Eosinophils Absolute 0.2 0.0 - 0.5 K/uL   Basophils Relative 0 %   Basophils Absolute 0.0 0.0 - 0.1 K/uL   Immature  Granulocytes 1 %   Abs Immature Granulocytes 0.06 0.00 - 0.07 K/uL  Comprehensive metabolic panel     Status: Abnormal   Collection Time: 07/15/19 11:43 AM  Result Value Ref Range   Sodium 136 135 - 145 mmol/L   Potassium 3.4 (L) 3.5 - 5.1 mmol/L   Chloride 104 98 - 111 mmol/L   CO2 26 22 - 32 mmol/L   Glucose, Bld 97 70 - 99 mg/dL   BUN 8 6 - 20 mg/dL   Creatinine, Ser 0.54 0.44 - 1.00 mg/dL   Calcium 8.1 (L) 8.9 - 10.3 mg/dL   Total Protein 4.7 (L) 6.5 - 8.1 g/dL   Albumin 1.9 (L) 3.5 - 5.0 g/dL   AST 15 15 - 41 U/L   ALT 17 0 - 44 U/L   Alkaline Phosphatase 74 38 - 126 U/L   Total Bilirubin <0.1 (L) 0.3 - 1.2 mg/dL   GFR calc non Af Amer >60 >60 mL/min   GFR calc Af Amer >60 >60 mL/min   Anion gap 6 5 - 15               Bloodtype: --/--/O NEG, O NEG Performed at [facility] [address] [place], [place] [address]  [phone])  Rubella: Immune (04/08 0000)                                             I&O: Intake/Output      10/26 0701 - 10/27 0700 10/27 0701 - 10/28 0700   I.V. (mL/kg)     Other     Total Intake(mL/kg)     Urine (mL/kg/hr)     Blood     Total Output     Net                       Physical Exam:             Alert and Oriented X3  Lungs: Clear and unlabored  Heart: regular rate and rhythm / no murmurs  Abdomen: soft, non-tender, mild gaseous distention, active bowel sounds             Fundus: firm, non-tender, U-1             Dressing: honeycomb dressing with steri-strips c/d/i              Incision:  approximated with sutures / no erythema / no ecchymosis / no drainage  Perineum: intact  Lochia: scant rubra on pad  Extremities: +2 LE edema, no calf pain or  tenderness,   A/P:    POD # 2 S/P [facility]            Chronic Hypertension with dependent edema   - on Procardia 30mg  XL daily   - Labile BPs, no neural s/s, labs stable    - Add HCTZ 25mg  PO daily x 5 days   ABL Anemia compounding chronic IDA - symptomatic with tachycardia and dizziness   - STAT CBC and CMP now   - Feraheme IV x 1 dose   - Then start Niferex 150mg  PO daily tomorrow   - Magnesium oxide 400mg  PO daily   RH Negative - baby RH negative    - no rhogam indicated  Anxiety   - stable on Buspar 7.5mg  BID  Change pain  medication regimen: add Motrin 600mg  q6hrs, d/c Tramadol, add Oxycodone IR 5-10mg  every 4hrs PRN for pain   Routine postoperative care   Breast care for formula feeding mother discussed              Reassess for d/c home in the afternoon   , MSN, CNM [facility]

## 2019-07-29 DIAGNOSIS — Z0130 Encounter for examination of blood pressure without abnormal findings: Secondary | ICD-10-CM | POA: Diagnosis not present

## 2019-07-29 DIAGNOSIS — O165 Unspecified maternal hypertension, complicating the puerperium: Secondary | ICD-10-CM | POA: Diagnosis not present

## 2019-08-20 ENCOUNTER — Other Ambulatory Visit: Payer: Self-pay

## 2019-08-20 DIAGNOSIS — Z20822 Contact with and (suspected) exposure to covid-19: Secondary | ICD-10-CM

## 2019-08-22 DIAGNOSIS — Z3042 Encounter for surveillance of injectable contraceptive: Secondary | ICD-10-CM | POA: Diagnosis not present

## 2019-08-23 LAB — NOVEL CORONAVIRUS, NAA: SARS-CoV-2, NAA: NOT DETECTED

## 2019-09-27 DIAGNOSIS — R7989 Other specified abnormal findings of blood chemistry: Secondary | ICD-10-CM | POA: Diagnosis not present

## 2019-09-27 DIAGNOSIS — M549 Dorsalgia, unspecified: Secondary | ICD-10-CM | POA: Diagnosis not present

## 2019-09-27 DIAGNOSIS — Z79899 Other long term (current) drug therapy: Secondary | ICD-10-CM | POA: Diagnosis not present

## 2019-09-27 DIAGNOSIS — R002 Palpitations: Secondary | ICD-10-CM | POA: Diagnosis not present

## 2019-09-27 DIAGNOSIS — R079 Chest pain, unspecified: Secondary | ICD-10-CM | POA: Diagnosis not present

## 2019-09-27 DIAGNOSIS — J45909 Unspecified asthma, uncomplicated: Secondary | ICD-10-CM | POA: Diagnosis not present

## 2019-11-21 DIAGNOSIS — Z3042 Encounter for surveillance of injectable contraceptive: Secondary | ICD-10-CM | POA: Diagnosis not present

## 2020-01-26 DIAGNOSIS — Z03818 Encounter for observation for suspected exposure to other biological agents ruled out: Secondary | ICD-10-CM | POA: Diagnosis not present

## 2020-01-26 DIAGNOSIS — Z20828 Contact with and (suspected) exposure to other viral communicable diseases: Secondary | ICD-10-CM | POA: Diagnosis not present

## 2020-05-03 DIAGNOSIS — Z03818 Encounter for observation for suspected exposure to other biological agents ruled out: Secondary | ICD-10-CM | POA: Diagnosis not present

## 2020-05-03 DIAGNOSIS — Z20822 Contact with and (suspected) exposure to covid-19: Secondary | ICD-10-CM | POA: Diagnosis not present

## 2020-05-07 DIAGNOSIS — Z20822 Contact with and (suspected) exposure to covid-19: Secondary | ICD-10-CM | POA: Diagnosis not present

## 2020-05-07 DIAGNOSIS — U071 COVID-19: Secondary | ICD-10-CM | POA: Diagnosis not present

## 2020-07-23 DIAGNOSIS — F419 Anxiety disorder, unspecified: Secondary | ICD-10-CM | POA: Diagnosis not present

## 2020-07-23 DIAGNOSIS — R0789 Other chest pain: Secondary | ICD-10-CM | POA: Diagnosis not present

## 2020-07-23 DIAGNOSIS — Z79899 Other long term (current) drug therapy: Secondary | ICD-10-CM | POA: Diagnosis not present

## 2020-07-23 DIAGNOSIS — Z8616 Personal history of COVID-19: Secondary | ICD-10-CM | POA: Diagnosis not present

## 2020-07-23 DIAGNOSIS — J45909 Unspecified asthma, uncomplicated: Secondary | ICD-10-CM | POA: Diagnosis not present

## 2020-07-23 DIAGNOSIS — R079 Chest pain, unspecified: Secondary | ICD-10-CM | POA: Diagnosis not present

## 2020-07-23 DIAGNOSIS — Z7951 Long term (current) use of inhaled steroids: Secondary | ICD-10-CM | POA: Diagnosis not present

## 2020-07-23 DIAGNOSIS — R0602 Shortness of breath: Secondary | ICD-10-CM | POA: Diagnosis not present

## 2021-09-20 LAB — OB RESULTS CONSOLE ANTIBODY SCREEN: Antibody Screen: NEGATIVE

## 2021-11-03 LAB — OB RESULTS CONSOLE RPR

## 2021-11-03 LAB — OB RESULTS CONSOLE RUBELLA ANTIBODY, IGM: Rubella: IMMUNE

## 2022-03-20 LAB — OB RESULTS CONSOLE RPR: RPR: NONREACTIVE

## 2022-04-18 ENCOUNTER — Other Ambulatory Visit: Payer: Self-pay | Admitting: Obstetrics

## 2022-05-03 ENCOUNTER — Encounter (HOSPITAL_COMMUNITY): Payer: Self-pay | Admitting: *Deleted

## 2022-05-03 NOTE — Patient Instructions (Addendum)
Emily Delgado  05/03/2022   Your procedure is scheduled on:  05/18/2022  Arrive at 1:00PM at Entrance C on [address] at [facility] [employer]. You are invited to use the FREE valet parking or use the Visitor's parking deck.  Pick up the phone at the desk and dial [phone].  Call this number if you have problems the morning of surgery: [phone]  Remember:   Do not eat food:(6 Hours before arrival) 6 horas ante llegada.light meal meaning ex. Toast and a clear liquid drink  Do not drink clear liquids: (4 Hours before arrival) 4 horas ante llegada.  Take these medicines the morning of surgery with A SIP OF WATER:  Labetalol as prescribed   Do not wear jewelry, make-up or nail polish.  Do not wear lotions, powders, or perfumes. Do not wear deodorant.  Do not shave 48 hours prior to surgery.  Do not bring valuables to the hospital.  [facility] is not   responsible for any belongings or valuables brought to the hospital.  Contacts, dentures or bridgework may not be worn into surgery.  Leave suitcase in the car. After surgery it may be brought to your room.  For patients admitted to the hospital, checkout time is 11:00 AM the day of              discharge.      Please read over the following fact sheets that you were given:     Preparing for Surgery

## 2022-05-05 ENCOUNTER — Encounter (HOSPITAL_COMMUNITY): Payer: Self-pay

## 2022-05-08 ENCOUNTER — Encounter (HOSPITAL_COMMUNITY): Payer: Self-pay

## 2022-05-08 ENCOUNTER — Telehealth (HOSPITAL_COMMUNITY): Payer: Self-pay | Admitting: *Deleted

## 2022-05-08 NOTE — Telephone Encounter (Signed)
Preadmission screen  

## 2022-05-10 ENCOUNTER — Encounter (HOSPITAL_COMMUNITY): Payer: Self-pay

## 2022-05-10 NOTE — H&P (Signed)
Emily Delgado is a 26 y.o. G2P1001 at [redacted]w[redacted]d presenting for RCS. Pt notes no contraction . Good fetal movement, No vaginal bleeding, not leaking fluid. Pt notes increase in edema and fatigue over the past week. Pt notes bps elevating over the past week. No HA, no scotomata.  Pt notes overall well but new development of a hemorrhoid last night.   [facility] at [facility] since 6 wks - Dated by 6 wks u/s/ NCWD -gestational htn on top of chronic htn. Pt with early onset htn as G1 (less than 20 wks) but bp resolved PP. Normal baseline PIH labs, started on baby ASA at start of preg, labetalol started 100mg  tid at 25 wks. Meds increased through preg to 400 tid. Bps increased to 150s/ 90s at 37 wks with normal labs other than Pr/Cr ratio of 0.36RCS moved up to 37 wks. - Anemia, on iron - Prior c/s after arrest at 4 cm after IOL, plan RCS - O neg    Prenatal Transfer Tool  Maternal Diabetes: No Genetic Screening: Normal Maternal Ultrasounds/Referrals: Normal Fetal Ultrasounds or other Referrals:  None Maternal Substance Abuse:  No Significant Maternal Medications:  None Significant Maternal Lab Results: Group B Strep negative     OB History     Gravida  2   Para  1   Term  1   Preterm  0   AB  0   Living  1      SAB  0   IAB  0   Ectopic  0   Multiple  0   Live Births  1          Past Medical History:  Diagnosis Date   Asthma    Hypertension    Past Surgical History:  Procedure Laterality Date   BREAST REDUCTION SURGERY     09/04/17   CESAREAN SECTION N/A 07/13/2019   Procedure: CESAREAN SECTION;  Surgeon: 07/15/2019, MD;  Location: [facility] LD ORS;  Service: Obstetrics;  Laterality: N/A;   Family History: family history is not on file. Social History:  reports that she has never smoked. She has never used smokeless tobacco. She reports that she does not drink alcohol and does not use drugs.  Review of Systems - Negative except increased edema,  fatigue      Physical Exam:  Vitals:   05/11/22 1317 05/11/22 1331  BP:  (!) 141/92  Resp:  16  Temp: 98.8 F (37.1 C) 98.8 F (37.1 C)    Gen: well appearing, no distress  Abd: gravid, NT, no RUQ pain LE: no edema, equal bilaterally, non-tender   Prenatal labs: ABO, Rh:  O neg Antibody: Negative (01/03 0000) Rubella: Immune (02/16 0000) RPR: Nonreactive (07/03 0000)  HBsAg:   neg HIV:   neg GBS:   neg 1 hr Glucola 81  Genetic screening normal INvitae NIPS Anatomy 01-09-1979 normal  CBC    Component Value Date/Time   WBC 10.0 05/11/2022 1258   RBC 4.20 05/11/2022 1258   HGB 13.3 05/11/2022 1258   HCT 39.8 05/11/2022 1258   PLT 206 05/11/2022 1258   MCV 94.8 05/11/2022 1258   MCH 31.7 05/11/2022 1258   MCHC 33.4 05/11/2022 1258   RDW 13.3 05/11/2022 1258   LYMPHSABS 2.0 07/15/2019 1141   MONOABS 0.7 07/15/2019 1141   EOSABS 0.2 07/15/2019 1141   BASOSABS 0.0 07/15/2019 1141    CMP     Component Value Date/Time   NA 136 05/11/2022 1258  K 4.2 05/11/2022 1258   CL 108 05/11/2022 1258   CO2 19 (L) 05/11/2022 1258   GLUCOSE 82 05/11/2022 1258   BUN 13 05/11/2022 1258   CREATININE 0.62 05/11/2022 1258   CALCIUM 8.9 05/11/2022 1258   PROT 5.8 (L) 05/11/2022 1258   ALBUMIN 2.8 (L) 05/11/2022 1258   AST 17 05/11/2022 1258   ALT 16 05/11/2022 1258   ALKPHOS 142 (H) 05/11/2022 1258   BILITOT 0.5 05/11/2022 1258   GFRNONAA >60 05/11/2022 1258   GFRAA >60 07/15/2019 1143     Assessment/Plan: 25 y.o. G2P1001 at [redacted]w[redacted]d - Gest on chronic htn. Move up c/s at 37 wks. Watch bps and labs PP - RCS. R/B d/w pt.  - Anemia, watch iron   Lendon Colonel 05/10/2022, 6:18 PM  Lendon Colonel 05/11/2022 2:20 PM

## 2022-05-11 ENCOUNTER — Inpatient Hospital Stay (HOSPITAL_COMMUNITY)
Admission: RE | Admit: 2022-05-11 | Discharge: 2022-05-13 | DRG: 787 | Disposition: A | Discharge status: Home / Self Care | Payer: 59 | Attending: Obstetrics | Admitting: Obstetrics

## 2022-05-11 ENCOUNTER — Other Ambulatory Visit: Payer: Self-pay

## 2022-05-11 ENCOUNTER — Encounter (HOSPITAL_COMMUNITY): Payer: Self-pay | Admitting: Obstetrics

## 2022-05-11 ENCOUNTER — Inpatient Hospital Stay (HOSPITAL_COMMUNITY): Payer: 59 | Admitting: Anesthesiology

## 2022-05-11 ENCOUNTER — Encounter (HOSPITAL_COMMUNITY)
Admission: RE | Disposition: A | Discharge status: Home / Self Care | Payer: Self-pay | Source: Home / Self Care | Attending: Obstetrics

## 2022-05-11 DIAGNOSIS — J45909 Unspecified asthma, uncomplicated: Secondary | ICD-10-CM | POA: Diagnosis present

## 2022-05-11 DIAGNOSIS — O99214 Obesity complicating childbirth: Secondary | ICD-10-CM | POA: Diagnosis present

## 2022-05-11 DIAGNOSIS — D509 Iron deficiency anemia, unspecified: Secondary | ICD-10-CM | POA: Diagnosis present

## 2022-05-11 DIAGNOSIS — O9902 Anemia complicating childbirth: Secondary | ICD-10-CM | POA: Diagnosis present

## 2022-05-11 DIAGNOSIS — O1002 Pre-existing essential hypertension complicating childbirth: Secondary | ICD-10-CM | POA: Diagnosis present

## 2022-05-11 DIAGNOSIS — O164 Unspecified maternal hypertension, complicating childbirth: Secondary | ICD-10-CM | POA: Diagnosis not present

## 2022-05-11 DIAGNOSIS — O34211 Maternal care for low transverse scar from previous cesarean delivery: Secondary | ICD-10-CM

## 2022-05-11 DIAGNOSIS — I10 Essential (primary) hypertension: Secondary | ICD-10-CM | POA: Diagnosis present

## 2022-05-11 DIAGNOSIS — O26893 Other specified pregnancy related conditions, third trimester: Secondary | ICD-10-CM | POA: Diagnosis present

## 2022-05-11 DIAGNOSIS — Z3A37 37 weeks gestation of pregnancy: Secondary | ICD-10-CM | POA: Diagnosis not present

## 2022-05-11 DIAGNOSIS — O9952 Diseases of the respiratory system complicating childbirth: Secondary | ICD-10-CM | POA: Diagnosis present

## 2022-05-11 DIAGNOSIS — Z6791 Unspecified blood type, Rh negative: Secondary | ICD-10-CM | POA: Diagnosis not present

## 2022-05-11 LAB — COMPREHENSIVE METABOLIC PANEL
ALT: 16 U/L (ref 0–44)
ALT: 15 U/L (ref 0–44)
AST: 17 U/L (ref 15–41)
AST: 16 U/L (ref 15–41)
Albumin: 2.8 g/dL — ABNORMAL LOW (ref 3.5–5.0)
Albumin: 2.4 g/dL — ABNORMAL LOW (ref 3.5–5.0)
Alkaline Phosphatase: 142 U/L — ABNORMAL HIGH (ref 38–126)
Alkaline Phosphatase: 119 U/L (ref 38–126)
Anion gap: 9 (ref 5–15)
Anion gap: 5 (ref 5–15)
BUN: 13 mg/dL (ref 6–20)
BUN: 17 mg/dL (ref 6–20)
CO2: 19 mmol/L — ABNORMAL LOW (ref 22–32)
CO2: 21 mmol/L — ABNORMAL LOW (ref 22–32)
Calcium: 8.9 mg/dL (ref 8.9–10.3)
Calcium: 8.7 mg/dL — ABNORMAL LOW (ref 8.9–10.3)
Chloride: 108 mmol/L (ref 98–111)
Chloride: 109 mmol/L (ref 98–111)
Creatinine, Ser: 0.62 mg/dL (ref 0.44–1.00)
Creatinine, Ser: 0.8 mg/dL (ref 0.44–1.00)
GFR, Estimated: >60 mL/min (ref >60)
GFR, Estimated: >60 mL/min (ref >60)
Glucose, Bld: 82 mg/dL (ref 70–99)
Glucose, Bld: 110 mg/dL — ABNORMAL HIGH (ref 70–99)
Potassium: 4.2 mmol/L (ref 3.5–5.1)
Potassium: 4.3 mmol/L (ref 3.5–5.1)
Sodium: 136 mmol/L (ref 135–145)
Sodium: 135 mmol/L (ref 135–145)
Total Bilirubin: 0.5 mg/dL (ref 0.3–1.2)
Total Bilirubin: 0.5 mg/dL (ref 0.3–1.2)
Total Protein: 5.8 g/dL — ABNORMAL LOW (ref 6.5–8.1)
Total Protein: 5.2 g/dL — ABNORMAL LOW (ref 6.5–8.1)

## 2022-05-11 LAB — CBC
HCT: 39.8 % (ref 36.0–46.0)
Hemoglobin: 13.3 g/dL (ref 12.0–15.0)
MCH: 31.7 pg (ref 26.0–34.0)
MCHC: 33.4 g/dL (ref 30.0–36.0)
MCV: 94.8 fL (ref 80.0–100.0)
Platelets: 206 10*3/uL (ref 150–400)
RBC: 4.2 MIL/uL (ref 3.87–5.11)
RDW: 13.3 % (ref 11.5–15.5)
WBC: 10 10*3/uL (ref 4.0–10.5)
nRBC: 0 % (ref 0.0–0.2)

## 2022-05-11 LAB — TYPE AND SCREEN
ABO/RH(D): O NEG
Antibody Screen: NEGATIVE

## 2022-05-11 LAB — URIC ACID: Uric Acid, Serum: 6.6 mg/dL (ref 2.5–7.1)

## 2022-05-11 SURGERY — Surgical Case
Anesthesia: Spinal

## 2022-05-11 MED ORDER — FENTANYL CITRATE (PF) 100 MCG/2ML IJ SOLN
INTRAMUSCULAR | Status: AC
  Filled 2022-05-11: qty 2

## 2022-05-11 MED ORDER — KETOROLAC TROMETHAMINE 30 MG/ML IJ SOLN: 30.0000 mg | Freq: Four times a day (QID) | INTRAMUSCULAR | Status: AC | PRN

## 2022-05-11 MED ORDER — CEFAZOLIN SODIUM-DEXTROSE 2-4 GM/100ML-% IV SOLN
INTRAVENOUS | Status: AC
  Filled 2022-05-11: qty 100

## 2022-05-11 MED ORDER — MENTHOL 3 MG MT LOZG: 1.0000 | LOZENGE | OROMUCOSAL | Status: DC | PRN

## 2022-05-11 MED ORDER — SCOPOLAMINE 1 MG/3DAYS TD PT72: 1.0000 | MEDICATED_PATCH | Freq: Once | TRANSDERMAL | Status: DC

## 2022-05-11 MED ORDER — DIPHENHYDRAMINE HCL 25 MG PO CAPS
25.0000 mg | ORAL_CAPSULE | Freq: Four times a day (QID) | ORAL | Status: DC | PRN
  Administered 2022-05-11: 25 mg via ORAL
  Filled 2022-05-11: qty 1

## 2022-05-11 MED ORDER — HYDROMORPHONE HCL 1 MG/ML IJ SOLN: 0.2500 mg | INTRAMUSCULAR | Status: DC | PRN

## 2022-05-11 MED ORDER — NALOXONE HCL 4 MG/10ML IJ SOLN: 1.0000 ug/kg/h | INTRAVENOUS | Status: DC | PRN

## 2022-05-11 MED ORDER — LACTATED RINGERS IV SOLN: INTRAVENOUS | Status: DC

## 2022-05-11 MED ORDER — DIBUCAINE (PERIANAL) 1 % EX OINT: 1.0000 | TOPICAL_OINTMENT | CUTANEOUS | Status: DC | PRN

## 2022-05-11 MED ORDER — SODIUM CHLORIDE 0.9% FLUSH: 3.0000 mL | INTRAVENOUS | Status: DC | PRN

## 2022-05-11 MED ORDER — PRENATAL MULTIVITAMIN CH
1.0000 | ORAL_TABLET | Freq: Every day | ORAL | Status: DC
  Administered 2022-05-12: 1 via ORAL
  Filled 2022-05-11: qty 1

## 2022-05-11 MED ORDER — DEXAMETHASONE SODIUM PHOSPHATE 4 MG/ML IJ SOLN
INTRAMUSCULAR | Status: AC
  Filled 2022-05-11: qty 1

## 2022-05-11 MED ORDER — SENNOSIDES-DOCUSATE SODIUM 8.6-50 MG PO TABS
2.0000 | ORAL_TABLET | ORAL | Status: DC
  Administered 2022-05-11: 2 via ORAL
  Filled 2022-05-11: qty 2

## 2022-05-11 MED ORDER — MEPERIDINE HCL 25 MG/ML IJ SOLN: 6.2500 mg | INTRAMUSCULAR | Status: DC | PRN

## 2022-05-11 MED ORDER — KETOROLAC TROMETHAMINE 30 MG/ML IJ SOLN: 30.0000 mg | Freq: Four times a day (QID) | INTRAMUSCULAR | Status: AC

## 2022-05-11 MED ORDER — KETOROLAC TROMETHAMINE 30 MG/ML IJ SOLN
INTRAMUSCULAR | Status: AC
  Filled 2022-05-11: qty 1

## 2022-05-11 MED ORDER — ACETAMINOPHEN 500 MG PO TABS
1000.0000 mg | ORAL_TABLET | Freq: Four times a day (QID) | ORAL | Status: DC
  Administered 2022-05-11 – 2022-05-13 (×6): 1000 mg via ORAL
  Filled 2022-05-11 (×4): qty 2

## 2022-05-11 MED ORDER — SIMETHICONE 80 MG PO CHEW
80.0000 mg | CHEWABLE_TABLET | ORAL | Status: DC | PRN
  Administered 2022-05-11: 80 mg via ORAL

## 2022-05-11 MED ORDER — OXYCODONE HCL 5 MG/5ML PO SOLN: 5.0000 mg | Freq: Once | ORAL | Status: DC | PRN

## 2022-05-11 MED ORDER — ACETAMINOPHEN 10 MG/ML IV SOLN
INTRAVENOUS | Status: DC | PRN
  Administered 2022-05-11: 1000 mg via INTRAVENOUS

## 2022-05-11 MED ORDER — ONDANSETRON HCL 4 MG/2ML IJ SOLN
INTRAMUSCULAR | Status: DC | PRN
  Administered 2022-05-11: 4 mg via INTRAVENOUS

## 2022-05-11 MED ORDER — CEFAZOLIN SODIUM-DEXTROSE 2-4 GM/100ML-% IV SOLN
2.0000 g | INTRAVENOUS | Status: AC
  Administered 2022-05-11: 2 g via INTRAVENOUS

## 2022-05-11 MED ORDER — BUPIVACAINE HCL 0.25 % IJ SOLN
INTRAMUSCULAR | Status: DC | PRN
  Administered 2022-05-11: 10 mL

## 2022-05-11 MED ORDER — LABETALOL HCL 5 MG/ML IV SOLN
40.0000 mg | INTRAVENOUS | Status: DC | PRN
  Administered 2022-05-11: 40 mg via INTRAVENOUS
  Filled 2022-05-11: qty 8

## 2022-05-11 MED ORDER — BUPIVACAINE IN DEXTROSE 0.75-8.25 % IT SOLN
INTRATHECAL | Status: DC | PRN
  Administered 2022-05-11: 1.5 mL via INTRATHECAL

## 2022-05-11 MED ORDER — HYDRALAZINE HCL 20 MG/ML IJ SOLN
10.0000 mg | INTRAMUSCULAR | Status: DC | PRN
Start: 2022-05-11 — End: 2022-05-13

## 2022-05-11 MED ORDER — OXYCODONE HCL 5 MG PO TABS
5.0000 mg | ORAL_TABLET | ORAL | Status: DC | PRN
  Administered 2022-05-13: 5 mg via ORAL
  Filled 2022-05-11: qty 1

## 2022-05-11 MED ORDER — KETOROLAC TROMETHAMINE 30 MG/ML IJ SOLN: 30.0000 mg | Freq: Once | INTRAMUSCULAR | Status: DC | PRN

## 2022-05-11 MED ORDER — LABETALOL HCL 5 MG/ML IV SOLN: 80.0000 mg | INTRAVENOUS | Status: DC | PRN

## 2022-05-11 MED ORDER — DEXAMETHASONE SODIUM PHOSPHATE 4 MG/ML IJ SOLN
INTRAMUSCULAR | Status: DC | PRN
  Administered 2022-05-11: 4 mg via INTRAVENOUS

## 2022-05-11 MED ORDER — AMISULPRIDE (ANTIEMETIC) 5 MG/2ML IV SOLN: 10.0000 mg | Freq: Once | INTRAVENOUS | Status: DC | PRN

## 2022-05-11 MED ORDER — POVIDONE-IODINE 10 % EX SWAB
2.0000 | Freq: Once | CUTANEOUS | Status: AC
  Administered 2022-05-11: 2 via TOPICAL

## 2022-05-11 MED ORDER — LABETALOL HCL 5 MG/ML IV SOLN
20.0000 mg | INTRAVENOUS | Status: DC | PRN
Start: 2022-05-11 — End: 2022-05-13
  Administered 2022-05-11: 20 mg via INTRAVENOUS
  Filled 2022-05-11 (×2): qty 4

## 2022-05-11 MED ORDER — KETOROLAC TROMETHAMINE 30 MG/ML IJ SOLN
INTRAMUSCULAR | Status: DC | PRN
  Administered 2022-05-11: 30 mg via INTRAVENOUS

## 2022-05-11 MED ORDER — MORPHINE SULFATE (PF) 0.5 MG/ML IJ SOLN
INTRAMUSCULAR | Status: DC | PRN
  Administered 2022-05-11: 150 mg via EPIDURAL

## 2022-05-11 MED ORDER — ONDANSETRON HCL 4 MG/2ML IJ SOLN
INTRAMUSCULAR | Status: AC
  Filled 2022-05-11: qty 2

## 2022-05-11 MED ORDER — DIPHENHYDRAMINE HCL 25 MG PO CAPS: 25.0000 mg | ORAL_CAPSULE | ORAL | Status: DC | PRN

## 2022-05-11 MED ORDER — SIMETHICONE 80 MG PO CHEW
80.0000 mg | CHEWABLE_TABLET | Freq: Three times a day (TID) | ORAL | Status: DC
  Administered 2022-05-12 – 2022-05-13 (×3): 80 mg via ORAL
  Filled 2022-05-11 (×4): qty 1

## 2022-05-11 MED ORDER — OXYTOCIN-SODIUM CHLORIDE 30-0.9 UT/500ML-% IV SOLN
INTRAVENOUS | Status: DC | PRN
  Administered 2022-05-11: 200 mL via INTRAVENOUS

## 2022-05-11 MED ORDER — IBUPROFEN 600 MG PO TABS
600.0000 mg | ORAL_TABLET | Freq: Four times a day (QID) | ORAL | Status: DC
  Administered 2022-05-11 – 2022-05-13 (×6): 600 mg via ORAL
  Filled 2022-05-11 (×6): qty 1

## 2022-05-11 MED ORDER — ACETAMINOPHEN 10 MG/ML IV SOLN
INTRAVENOUS | Status: AC
  Filled 2022-05-11: qty 100

## 2022-05-11 MED ORDER — PHENYLEPHRINE HCL-NACL 20-0.9 MG/250ML-% IV SOLN
INTRAVENOUS | Status: AC
  Filled 2022-05-11: qty 500

## 2022-05-11 MED ORDER — DIPHENHYDRAMINE HCL 50 MG/ML IJ SOLN: 12.5000 mg | INTRAMUSCULAR | Status: DC | PRN

## 2022-05-11 MED ORDER — NALOXONE HCL 0.4 MG/ML IJ SOLN: 0.4000 mg | INTRAMUSCULAR | Status: DC | PRN

## 2022-05-11 MED ORDER — ZOLPIDEM TARTRATE 5 MG PO TABS: 5.0000 mg | ORAL_TABLET | Freq: Every evening | ORAL | Status: DC | PRN

## 2022-05-11 MED ORDER — ONDANSETRON HCL 4 MG/2ML IJ SOLN: 4.0000 mg | Freq: Once | INTRAMUSCULAR | Status: DC | PRN

## 2022-05-11 MED ORDER — OXYCODONE HCL 5 MG PO TABS: 5.0000 mg | ORAL_TABLET | Freq: Once | ORAL | Status: DC | PRN

## 2022-05-11 MED ORDER — MORPHINE SULFATE (PF) 0.5 MG/ML IJ SOLN
INTRAMUSCULAR | Status: AC
  Filled 2022-05-11: qty 10

## 2022-05-11 MED ORDER — ACETAMINOPHEN 500 MG PO TABS
1000.0000 mg | ORAL_TABLET | Freq: Four times a day (QID) | ORAL | Status: DC
  Filled 2022-05-11 (×2): qty 2

## 2022-05-11 MED ORDER — NIFEDIPINE ER OSMOTIC RELEASE 30 MG PO TB24
30.0000 mg | ORAL_TABLET | Freq: Once | ORAL | Status: AC
  Administered 2022-05-11: 30 mg via ORAL
  Filled 2022-05-11: qty 1

## 2022-05-11 MED ORDER — OXYTOCIN-SODIUM CHLORIDE 30-0.9 UT/500ML-% IV SOLN
INTRAVENOUS | Status: AC
  Filled 2022-05-11: qty 1000

## 2022-05-11 MED ORDER — STERILE WATER FOR IRRIGATION IR SOLN
Status: DC | PRN
  Administered 2022-05-11: 1000 mL

## 2022-05-11 MED ORDER — TETANUS-DIPHTH-ACELL PERTUSSIS 5-2.5-18.5 LF-MCG/0.5 IM SUSY: 0.5000 mL | PREFILLED_SYRINGE | Freq: Once | INTRAMUSCULAR | Status: DC

## 2022-05-11 MED ORDER — LABETALOL HCL 200 MG PO TABS: 400.0000 mg | ORAL_TABLET | Freq: Three times a day (TID) | ORAL | Status: DC

## 2022-05-11 MED ORDER — OXYTOCIN-SODIUM CHLORIDE 30-0.9 UT/500ML-% IV SOLN
2.5000 [IU]/h | INTRAVENOUS | Status: AC
  Filled 2022-05-11: qty 500

## 2022-05-11 MED ORDER — ONDANSETRON HCL 4 MG/2ML IJ SOLN: 4.0000 mg | Freq: Three times a day (TID) | INTRAMUSCULAR | Status: DC | PRN

## 2022-05-11 MED ORDER — FENTANYL CITRATE (PF) 100 MCG/2ML IJ SOLN
INTRAMUSCULAR | Status: DC | PRN
  Administered 2022-05-11: 15 ug via INTRAVENOUS

## 2022-05-11 MED ORDER — WITCH HAZEL-GLYCERIN EX PADS
1.0000 | MEDICATED_PAD | CUTANEOUS | Status: DC | PRN
Start: 2022-05-11 — End: 2022-05-13

## 2022-05-11 MED ORDER — COCONUT OIL OIL: 1.0000 | TOPICAL_OIL | Status: DC | PRN

## 2022-05-11 MED ORDER — PHENYLEPHRINE HCL-NACL 20-0.9 MG/250ML-% IV SOLN
INTRAVENOUS | Status: DC | PRN
  Administered 2022-05-11: 30 ug/min via INTRAVENOUS

## 2022-05-11 SURGICAL SUPPLY — 37 items
APL SKNCLS STERI-STRIP NONHPOA (GAUZE/BANDAGES/DRESSINGS) ×1
BENZOIN TINCTURE PRP APPL 2/3 (GAUZE/BANDAGES/DRESSINGS) IMPLANT
CHLORAPREP W/TINT 26ML (MISCELLANEOUS) ×2 IMPLANT
CLAMP CORD UMBIL (MISCELLANEOUS) ×1 IMPLANT
CLOTH BEACON ORANGE TIMEOUT ST (SAFETY) ×1 IMPLANT
DRSG OPSITE POSTOP 4X10 (GAUZE/BANDAGES/DRESSINGS) ×1 IMPLANT
ELECT REM PT RETURN 9FT ADLT (ELECTROSURGICAL) ×1
ELECTRODE REM PT RTRN 9FT ADLT (ELECTROSURGICAL) ×1 IMPLANT
EXTRACTOR VACUUM M CUP 4 TUBE (SUCTIONS) IMPLANT
GLOVE BIO SURGEON STRL SZ 6.5 (GLOVE) ×1 IMPLANT
GLOVE BIOGEL PI IND STRL 7.0 (GLOVE) ×3 IMPLANT
GLOVE BIOGEL PI INDICATOR 7.0 (GLOVE) ×3
GOWN STRL REUS W/TWL LRG LVL3 (GOWN DISPOSABLE) ×2 IMPLANT
KIT ABG SYR 3ML LUER SLIP (SYRINGE) IMPLANT
MAT PREVALON FULL STRYKER (MISCELLANEOUS) IMPLANT
NDL HYPO 25X5/8 SAFETYGLIDE (NEEDLE) IMPLANT
NEEDLE HYPO 22GX1.5 SAFETY (NEEDLE) IMPLANT
NEEDLE HYPO 25X5/8 SAFETYGLIDE (NEEDLE) IMPLANT
NS IRRIG 1000ML POUR BTL (IV SOLUTION) ×1 IMPLANT
PACK C SECTION WH (CUSTOM PROCEDURE TRAY) ×1 IMPLANT
PAD OB MATERNITY 4.3X12.25 (PERSONAL CARE ITEMS) ×1 IMPLANT
RETRACTOR TRAXI PANNICULUS (MISCELLANEOUS) IMPLANT
STRIP CLOSURE SKIN 1/2X4 (GAUZE/BANDAGES/DRESSINGS) IMPLANT
SUT MON AB 4-0 PS1 27 (SUTURE) ×1 IMPLANT
SUT PLAIN 0 NONE (SUTURE) IMPLANT
SUT PLAIN 2 0 XLH (SUTURE) IMPLANT
SUT VIC AB 0 CT1 36 (SUTURE) ×2 IMPLANT
SUT VIC AB 0 CTX 36 (SUTURE) ×2
SUT VIC AB 0 CTX36XBRD ANBCTRL (SUTURE) ×2 IMPLANT
SUT VIC AB 2-0 CT1 27 (SUTURE) ×1
SUT VIC AB 2-0 CT1 TAPERPNT 27 (SUTURE) ×1 IMPLANT
SUT VIC AB 4-0 KS 27 (SUTURE) IMPLANT
SYR CONTROL 10ML LL (SYRINGE) IMPLANT
TOWEL OR 17X24 6PK STRL BLUE (TOWEL DISPOSABLE) ×1 IMPLANT
TRAXI PANNICULUS RETRACTOR (MISCELLANEOUS) ×1
TRAY FOLEY W/BAG SLVR 14FR LF (SET/KITS/TRAYS/PACK) IMPLANT
WATER STERILE IRR 1000ML POUR (IV SOLUTION) ×1 IMPLANT

## 2022-05-11 NOTE — Anesthesia Postprocedure Evaluation (Signed)
Anesthesia Post Note  Patient: Emily Delgado  Procedure(s) Performed: Repeat CESAREAN SECTION     Patient location during evaluation: PACU Anesthesia Type: Spinal Level of consciousness: awake and alert and oriented Pain management: pain level controlled Vital Signs Assessment: post-procedure vital signs reviewed and stable Respiratory status: spontaneous breathing, nonlabored ventilation and respiratory function stable Cardiovascular status: blood pressure returned to baseline and stable Postop Assessment: no headache, no backache, spinal receding, patient able to bend at knees and no apparent nausea or vomiting Anesthetic complications: no   No notable events documented.  Last Vitals:  Vitals:   05/11/22 1951 05/11/22 2007  BP: (!) 166/99 (!) 154/95  Pulse: 82 79  Resp:    Temp:    SpO2:      Last Pain:  Vitals:   05/11/22 1900  TempSrc: Oral  PainSc:                  [name]

## 2022-05-11 NOTE — Progress Notes (Addendum)
Dr. Ernestina Penna notified of elevated BP's postpartum. Orders to begin antihypertensive protocol received. And to give 30mg  procardiaXR now. DC 400mg  labetolol order for 2200.

## 2022-05-11 NOTE — Brief Op Note (Signed)
05/11/2022  4:53 PM  PATIENT:  Emily Delgado  26 y.o. female  PRE-OPERATIVE DIAGNOSIS:  Previous Cesarean Section, Chronic Hypertension  POST-OPERATIVE DIAGNOSIS:  Previous Cesarean Section, Chronic Hypertension  PROCEDURE:  Procedure(s) with comments: Repeat CESAREAN SECTION (N/A) - EDD: 05/30/22 Low-transverse cesarean section with 2 layer closure  SURGEON:  Surgeon(s) and Role:    Noland Fordyce, MD - Primary  PHYSICIAN ASSISTANT: Arlan Organ, CNM  ASSISTANTS: As above  ANESTHESIA:   spinal  EBL: Per nursing notes  BLOOD ADMINISTERED:none  DRAINS: Urinary Catheter (Foley)   LOCAL MEDICATIONS USED:  NONE  SPECIMEN:  Source of Specimen:  Placenta  DISPOSITION OF SPECIMEN:   To labor and delivery for disposal  COUNTS:  YES  TOURNIQUET:  * No tourniquets in log *  DICTATION: .Note written in EPIC  PLAN OF CARE: Admit to inpatient   PATIENT DISPOSITION:  PACU - hemodynamically stable.   Delay start of Pharmacological VTE agent (>24hrs) due to surgical blood loss or risk of bleeding: yes

## 2022-05-11 NOTE — Op Note (Signed)
05/11/2022  4:53 PM  PATIENT:  Emily Delgado  26 y.o. female  PRE-OPERATIVE DIAGNOSIS:  Previous Cesarean Section, Chronic Hypertension  POST-OPERATIVE DIAGNOSIS:  Previous Cesarean Section, Chronic Hypertension  PROCEDURE:  Procedure(s) with comments: Repeat CESAREAN SECTION (N/A) - EDD: 05/30/22 Low-transverse cesarean section with 2 layer closure  SURGEON:  Surgeon(s) and Role:    Noland Fordyce, MD - Primary  PHYSICIAN ASSISTANT: Arlan Organ, CNM  ASSISTANTS: As above  ANESTHESIA:   spinal  EBL: Per nursing notes  BLOOD ADMINISTERED:none  DRAINS: Urinary Catheter (Foley)   LOCAL MEDICATIONS USED:  NONE  SPECIMEN:  Source of Specimen:  Placenta  DISPOSITION OF SPECIMEN:  To labor and delivery for disposal  COUNTS:  YES  TOURNIQUET:  * No tourniquets in log *  DICTATION: .Note written in EPIC  PLAN OF CARE: Admit to inpatient   PATIENT DISPOSITION:  PACU - hemodynamically stable.   Delay start of Pharmacological VTE agent (>24hrs) due to surgical blood loss or risk of bleeding: yes    Findings:  @BABYSEXEBC @ infant,  APGAR (1 MIN): 9   APGAR (5 MINS): 10   APGAR (10 MINS):   Normal uterus, tubes and ovaries, normal placenta. 3VC, clear amniotic fluid  EBL: Per nursing notes cc Antibiotics:   2g Ancef Complications: none  Indications: This is a 26 y.o. year-old, G2, P1 at [redacted]w[redacted]d admitted for repeat cesarean section due to history of gestational on chronic hypertension.. Risks benefits and alternatives of the procedure were discussed with the patient who agreed to proceed  Procedure:  After informed consent was obtained the patient was taken to the operating room where spinal anesthesia was initiated.  She was prepped and draped in the normal sterile fashion in dorsal supine position with a leftward tilt.  A foley catheter was in place.  A Pfannenstiel skin incision was made 2 cm above the pubic symphysis in the midline with the scalpel, over the  old Pfannenstiel skin incision which was marked prior to start.  Dissection was carried down with the Bovie cautery until the fascia was reached. The fascia was incised in the midline. The incision was extended laterally with the Mayo scissors. The inferior aspect of the fascial incision was grasped with the Coker clamps, elevated up and the underlying rectus muscles were dissected off sharply. The superior aspect of the fascial incision was grasped with the Coker clamps elevated up and the underlying rectus muscles were dissected off sharply.  The peritoneum was entered sharply. The peritoneal incision was extended superiorly and inferiorly with good visualization of the bladder.  Of note significant omental adhesions were noted on the left upper aspect of the peritoneum . the bladder blade was inserted and palpation was done to assess the fetal position and the location of the uterine vessels. The lower segment of the uterus was incised sharply with the scalpel and extended  bluntly in the cephalo-caudal fashion. The infant was grasped, brought to the incision,  rotated and the infant was delivered with fundal pressure. The nose and mouth were bulb suctioned. The cord was clamped and cut after 1 minute delay. The infant was handed off to the waiting pediatrician. The placenta was expressed. The uterus was exteriorized. The uterus was cleared of all clots and debris. The uterine incision was repaired with 0 Vicryl in a running locked fashion.  A second layer of the same suture was used in an imbricating fashion to obtain excellent hemostasis.  The uterus was then returned to the  abdomen, the gutters were cleared of all clots and debris. The uterine incision was reinspected and found to be hemostatic. The peritoneum was then reevaluated.  A very broad attachment of the omentum was noted just to the left aspect of the peritoneum however there was enough free peritoneum to allow the peritoneal closure.  The omentum  was not further taken down.  Peritoneum was grasped and closed with 2-0 Vicryl in a running fashion. The cut muscle edges and the underside of the fascia were inspected and found to be hemostatic. The fascia was closed with 0 Vicryl in a single layer. The subcutaneous tissue was irrigated. Scarpa's layer was closed with a 2-0 plain gut suture. The skin was closed with a 4-0 Monocryl in a single layer. The patient tolerated the procedure well. Sponge lap and needle counts were correct x3 and patient was taken to the recovery room in a stable condition.  Lendon Colonel 05/11/2022 4:55 PM

## 2022-05-11 NOTE — Transfer of Care (Signed)
Immediate Anesthesia Transfer of Care Note  Patient: Emily Delgado  Procedure(s) Performed: Repeat CESAREAN SECTION  Patient Location: PACU  Anesthesia Type:Spinal  Level of Consciousness: awake, alert  and oriented  Airway & Oxygen Therapy: Patient Spontanous Breathing  Post-op Assessment: Report given to RN and Post -op Vital signs reviewed and stable  Post vital signs: Reviewed and stable HR 70, RR15, SaO2 96%, BP 146/95  Last Vitals:  Vitals Value Taken Time  BP    Temp    Pulse    Resp    SpO2      Last Pain:  Vitals:   05/11/22 1331  TempSrc: Oral  PainSc: 0-No pain         Complications: No notable events documented.

## 2022-05-11 NOTE — Lactation Note (Signed)
This note was copied from a baby's chart. Lactation Consultation Note  Patient Name: Emily Delgado [id number]'[id number] Date: 05/11/2022   Age:27 hours Birth Parent is formula feeding only. Maternal Data    Feeding    LATCH Score                    Lactation Tools Discussed/Used    Interventions    Discharge    Consult Status Consult Status: Complete    Danelle Earthly 05/11/2022, 8:41 PM

## 2022-05-11 NOTE — Anesthesia Preprocedure Evaluation (Addendum)
Anesthesia Evaluation  Patient identified by MRN, date of birth, ID band Patient awake    Reviewed: Allergy & Precautions, NPO status , Patient's Chart, lab work & pertinent test results, reviewed documented beta blocker date and time   Airway Mallampati: IV  TM Distance: >3 FB Neck ROM: Full    Dental  (+) Dental Advisory Given, Teeth Intact   Pulmonary asthma (well controlled, rescue inhaler once every few weeks) ,    Pulmonary exam normal breath sounds clear to auscultation       Cardiovascular hypertension (141/92 in preop), Pt. on home beta blockers and Pt. on medications Normal cardiovascular exam Rhythm:Regular Rate:Normal     Neuro/Psych negative neurological ROS  negative psych ROS   GI/Hepatic negative GI ROS, Neg liver ROS,   Endo/Other  Obesity BMI 39  Renal/GU negative Renal ROS  negative genitourinary   Musculoskeletal negative musculoskeletal ROS (+)   Abdominal (+) + obese,   Peds  Hematology Hb 13.3, plt 206   Anesthesia Other Findings   Reproductive/Obstetrics (+) Pregnancy Previous section 2020                           Anesthesia Physical Anesthesia Plan  ASA: 3  Anesthesia Plan: Spinal   Post-op Pain Management: Regional block, Toradol IV (intra-op) and Ofirmev IV (intra-op)*   Induction:   PONV Risk Score and Plan: 3 and Ondansetron, Dexamethasone and Treatment may vary due to age or medical condition  Airway Management Planned: Natural Airway  Additional Equipment: None  Intra-op Plan:   Post-operative Plan:   Informed Consent: I have reviewed the patients History and Physical, chart, labs and discussed the procedure including the risks, benefits and alternatives for the proposed anesthesia with the patient or authorized representative who has indicated his/her understanding and acceptance.       Plan Discussed with: CRNA  Anesthesia Plan Comments:         Anesthesia Quick Evaluation

## 2022-05-12 LAB — CBC
HCT: 33.4 % — ABNORMAL LOW (ref 36.0–46.0)
Hemoglobin: 11.5 g/dL — ABNORMAL LOW (ref 12.0–15.0)
MCH: 32.1 pg (ref 26.0–34.0)
MCHC: 34.4 g/dL (ref 30.0–36.0)
MCV: 93.3 fL (ref 80.0–100.0)
Platelets: 178 10*3/uL (ref 150–400)
RBC: 3.58 MIL/uL — ABNORMAL LOW (ref 3.87–5.11)
RDW: 13 % (ref 11.5–15.5)
WBC: 15.5 10*3/uL — ABNORMAL HIGH (ref 4.0–10.5)
nRBC: 0 % (ref 0.0–0.2)

## 2022-05-12 LAB — RPR: RPR Ser Ql: NONREACTIVE

## 2022-05-12 NOTE — Progress Notes (Signed)
POD#1  S: Pt notes pain controlled w/ po meds, minimal lochia, nl void, out of bed w/o dizziness or chest pain, tol reg po, minimal blood positive flatus. Pt is  breastfeeding.  Foley out.  No void yet.  Patient notes mild headache this morning.  Was not having headaches yesterday even with the elevated blood pressure.  Blood pressure medicines changed yesterday after needing IV labetalol x2.  Patient notes no scotomata, no right upper quadrant pain  Vitals:   05/12/22 0020 05/12/22 0300 05/12/22 0545 05/12/22 0749  BP: 131/86  124/74 128/88  Pulse: 79  79 82  Resp: 17 18  18   Temp: 98.7 F (37.1 C) 98.7 F (37.1 C)  98.2 F (36.8 C)  TempSrc: Oral Oral  Oral  SpO2: 97%   99%    Gen: Obese , well appearing CV: RRR Pulm: CTAB Abd: soft, ND, approp tender, fundus below umbilicus, NT, no right upper quadrant pain Inc: Pressure dressing in place, no staining LE: tr edema, NT, 2+ DTR, no clonus  CBC    Component Value Date/Time   WBC 15.5 (H) 05/12/2022 0518   RBC 3.58 (L) 05/12/2022 0518   HGB 11.5 (L) 05/12/2022 0518   HCT 33.4 (L) 05/12/2022 0518   PLT 178 05/12/2022 0518   MCV 93.3 05/12/2022 0518   MCH 32.1 05/12/2022 0518   MCHC 34.4 05/12/2022 0518   RDW 13.0 05/12/2022 0518   LYMPHSABS 2.0 07/15/2019 1141   MONOABS 0.7 07/15/2019 1141   EOSABS 0.2 07/15/2019 1141   BASOSABS 0.0 07/15/2019 1141    A/P: POD#1 s/p repeat C-section at 37 weeks for gestational on top of chronic hypertension - post-op. Doing well.  -Hypertension.  Patient was on labetalol 400 mg 3 times daily prior to delivery.  She did need 2 doses of IV push labetalol we have switched her to Procardia for ease of dosing.  We will continue to follow blood pressures here.  Patient did have elevated protein creatinine ratio in the office this week.  Other labs have been normal  07/17/2019 05/12/2022 8:40 AM

## 2022-05-13 MED ORDER — NIFEDIPINE ER OSMOTIC RELEASE 30 MG PO TB24
30.0000 mg | ORAL_TABLET | Freq: Every day | ORAL | Status: DC
  Administered 2022-05-13: 30 mg via ORAL
  Filled 2022-05-13: qty 1

## 2022-05-13 MED ORDER — OXYCODONE HCL 5 MG PO TABS: 5.0000 mg | ORAL_TABLET | ORAL | 0 refills | Status: AC | PRN

## 2022-05-13 MED ORDER — IBUPROFEN 600 MG PO TABS
600.0000 mg | ORAL_TABLET | Freq: Four times a day (QID) | ORAL | 0 refills | Status: AC
Start: 2022-05-13 — End: ?

## 2022-05-13 MED ORDER — NIFEDIPINE ER 30 MG PO TB24
30.0000 mg | ORAL_TABLET | Freq: Every day | ORAL | 1 refills | Status: AC
Start: 2022-05-14 — End: ?

## 2022-05-13 MED ORDER — ACETAMINOPHEN 500 MG PO TABS
1000.0000 mg | ORAL_TABLET | Freq: Four times a day (QID) | ORAL | 0 refills | Status: AC
Start: 2022-05-13 — End: ?

## 2022-05-13 NOTE — Discharge Summary (Signed)
OB Discharge Summary  Patient Name: Emily Delgado DOB: Jan 28, 1996 MRN: [id number]  Date of admission: 05/11/2022 Delivering provider: Noland Fordyce   Admitting diagnosis: Chronic hypertension [I10] Intrauterine pregnancy: [redacted]w[redacted]d     Secondary diagnosis: Patient Active Problem List   Diagnosis Date Noted   Iron deficiency anemia during pregnancy 05/11/2022   RhD negative/Baby Rh negative 05/11/2022   Chronic hypertension 07/14/2019   Cesarean delivery delivered 07/13/2019   Postpartum care following cesarean delivery 8/24 07/13/2019    Date of discharge: 05/13/2022   Discharge diagnosis: Principal Problem:   Postpartum care following cesarean delivery 8/24 Active Problems:   Cesarean delivery delivered   Chronic hypertension   Iron deficiency anemia during pregnancy   RhD negative/Baby Rh negative                                                             Augmentation: N/A Pain control: Spinal  Laceration:None  Complications: None  Hospital course:  Scheduled C/S   26 y.o. yo G2P2002 at [redacted]w[redacted]d was admitted to the hospital 05/11/2022 for scheduled cesarean section with the following indication:Elective Repeat and cHTN .Delivery details are as follows:  Membrane Rupture Time/Date:  ,   Delivery Method:C-Section, Low Transverse  Details of operation can be found in separate operative note.  Patient had a postpartum course complicated by elevated blood pressures. She was started on Procardia XL 30mg  daily. Her PEC labs are stable and she denies PEC symptoms. She will follow-up in the office on Monday for a blood pressure check. She is ambulating, tolerating a regular diet, passing flatus, and urinating well. Patient is discharged home in stable condition on  05/13/22        Newborn Data: Birth date:05/11/2022  Birth time:3:34 PM  Gender:Female  Living status:Living  Apgars:9 ,10  Weight:3150 g     Physical exam  Vitals:   05/12/22 1527 05/12/22 2132 05/12/22 2204  05/13/22 0553  BP: (!) 141/101 (!) 154/97 134/76 (!) 139/93  Pulse: 90 80 83 83  Resp: 18 18  16   Temp: 97.9 F (36.6 C) 98 F (36.7 C)  98.3 F (36.8 C)  TempSrc: Oral Oral  Oral  SpO2: 99% 100%  99%   General: alert and cooperative Lochia: appropriate Uterine Fundus: firm Incision: Healing well with no significant drainage, Dressing is clean, dry, and intact DVT Evaluation: No evidence of DVT seen on physical exam.  Labs: Lab Results  Component Value Date   WBC 15.5 (H) 05/12/2022   HGB 11.5 (L) 05/12/2022   HCT 33.4 (L) 05/12/2022   MCV 93.3 05/12/2022   PLT 178 05/12/2022      Latest Ref Rng & Units 05/11/2022    9:36 PM  CMP  Glucose 70 - 99 mg/dL 05/14/2022   BUN 6 - 20 mg/dL 17   Creatinine 05/13/2022 - 1.00 mg/dL 696   Sodium 7.89 - 3.81 mmol/L 135   Potassium 3.5 - 5.1 mmol/L 4.3   Chloride 98 - 111 mmol/L 109   CO2 22 - 32 mmol/L 21   Calcium 8.9 - 10.3 mg/dL 8.7   Total Protein 6.5 - 8.1 g/dL 5.2   Total Bilirubin 0.3 - 1.2 mg/dL 0.5   Alkaline Phos 38 - 126 U/L 119   AST 15 - 41 U/L 16  ALT 0 - 44 U/L 15       05/12/2022    5:11 PM 07/15/2019    9:52 AM 07/14/2019    9:40 PM  Edinburgh Postnatal Depression Scale Screening Tool  I have been able to laugh and see the funny side of things. 0 0 1  I have looked forward with enjoyment to things. 0 0 0  I have blamed myself unnecessarily when things went wrong. 1 2 2   I have been anxious or worried for no good reason. 0 2 2  I have felt scared or panicky for no good reason. 0 1 2  Things have been getting on top of me. 0 0 1  I have been so unhappy that I have had difficulty sleeping. 1 0 1  I have felt sad or miserable. 0 0 1  I have been so unhappy that I have been crying. 0 0 1  The thought of harming myself has occurred to me. 0 0 0  Edinburgh Postnatal Depression Scale Total 2 5 11    Discharge instructions:  per After Visit Summary  After Visit Meds:  Allergies as of 05/13/2022   No Known Allergies       Medication List     STOP taking these medications    albuterol 108 (90 Base) MCG/ACT inhaler Commonly known as: VENTOLIN HFA   aspirin EC 81 MG tablet   labetalol 200 MG tablet Commonly known as: NORMODYNE       TAKE these medications    acetaminophen 500 MG tablet Commonly known as: TYLENOL Take 2 tablets (1,000 mg total) by mouth every 6 (six) hours.   ibuprofen 600 MG tablet Commonly known as: ADVIL Take 1 tablet (600 mg total) by mouth every 6 (six) hours.   NIFEdipine 30 MG 24 hr tablet Commonly known as: ADALAT CC Take 1 tablet (30 mg total) by mouth daily. Start taking on: May 14, 2022   oxyCODONE 5 MG immediate release tablet Commonly known as: Oxy IR/ROXICODONE Take 1-2 tablets (5-10 mg total) by mouth every 4 (four) hours as needed for moderate pain.   prenatal multivitamin Tabs tablet Take 1 tablet by mouth in the morning.       Activity: Advance as tolerated. Pelvic rest for 6 weeks.   Newborn Data: Live born female  Birth Weight: 6 lb 15.1 oz (3150 g) APGAR: 9, 10  Newborn Delivery   Birth date/time: 05/11/2022 15:34:00 Delivery type: C-Section, Low Transverse Trial of labor: No C-section categorization: Repeat     Named May 16, 2022 Baby Feeding: Bottle Disposition:home with mother  Delivery Report:  Review the Delivery Report for details.    Follow up:  Follow-up Information     05/13/2022, MD. Schedule an appointment as soon as possible for a visit on 05/15/2022.   Specialty: Obstetrics and Gynecology Why: For PP BP check. Contact information: [address] [place] [address] [place] [phone]                [id number], MSN 05/13/2022, 10:56 AM

## 2022-05-16 ENCOUNTER — Other Ambulatory Visit (HOSPITAL_COMMUNITY)
Admission: RE | Admit: 2022-05-16 | Discharge: 2022-05-16 | Disposition: A | Discharge status: Home / Self Care | Payer: 59 | Source: Ambulatory Visit | Attending: Family Medicine | Admitting: Family Medicine

## 2022-05-19 ENCOUNTER — Telehealth (HOSPITAL_COMMUNITY): Payer: Self-pay | Admitting: *Deleted

## 2022-05-19 NOTE — Telephone Encounter (Signed)
Left phone voicemail message.  Duffy Rhody, RN 05-19-2022 at 9:56am
